# Patient Record
Sex: Female | Born: 1970 | Race: White | Hispanic: No | Marital: Married
Health system: Southern US, Academic
[De-identification: ages and names within clinical notes are randomized; demographics above are authoritative.]

---

## 2011-09-29 ENCOUNTER — Other Ambulatory Visit (HOSPITAL_COMMUNITY): Payer: Self-pay

## 2020-05-21 IMAGING — MR MRI JOINT UPPER EXTREMITY WITHOUT CONTRAST RT
6 series · 38 of 40 positions shown · IV contrast (gadolinium)
Comparison: Shoulder radiographs from outside facility dated 10/19/2019.

﻿EXAM:  MRI JOINT UPPER EXTREMITY WITHOUT CONTRAST RT
INDICATION: Chronic right shoulder pain.
TECHNIQUE: Multiplanar multisequential MRI of the right shoulder joint was performed without gadolinium contrast.

[Series 6: T1 · oblique · right · 3.5mm · 0.33mm/px · 7 of 21 slices shown]
[im 1/21]
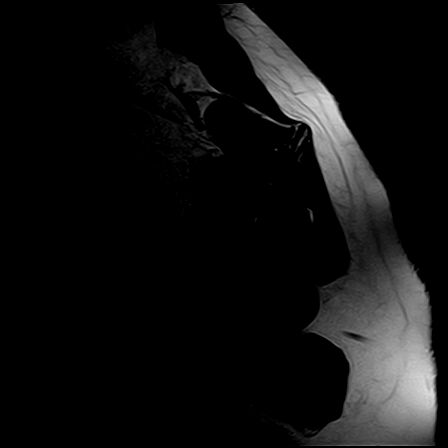
[im 4/21]
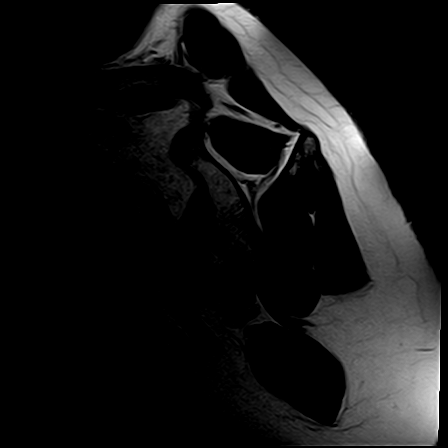
[im 7/21]
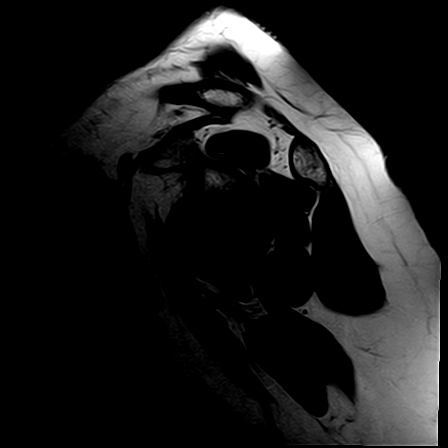
[im 11/21]
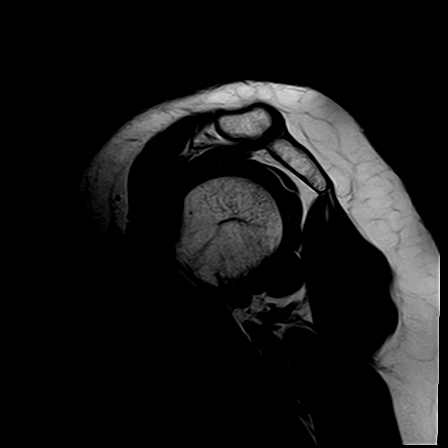
[im 14/21]
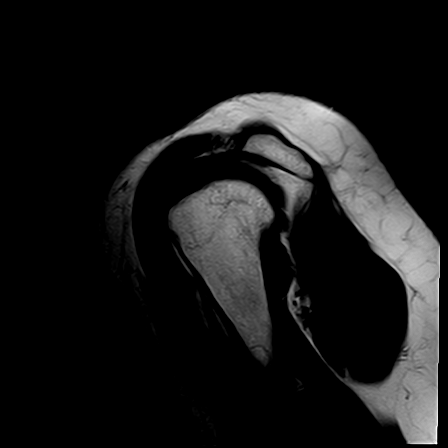
[im 17/21]
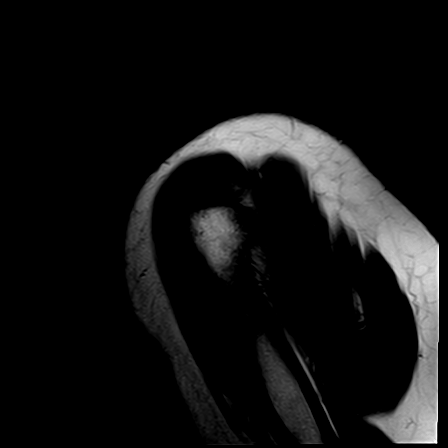
[im 21/21]
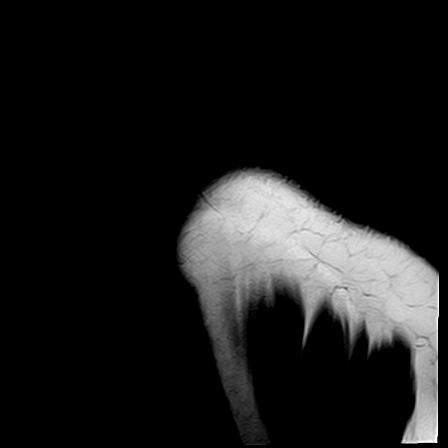

[Series 7: T2 fat-sat · axial · right · 4.0mm · 0.42mm/px · z∈[-38,+57]mm · 8 of 24 slices shown]
[im 1/24]
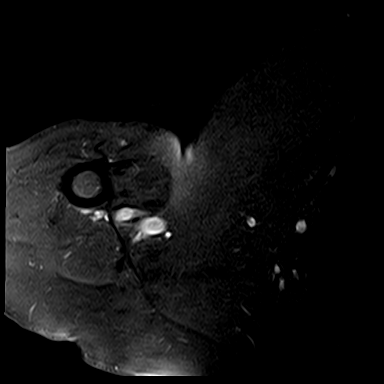
[im 4/24]
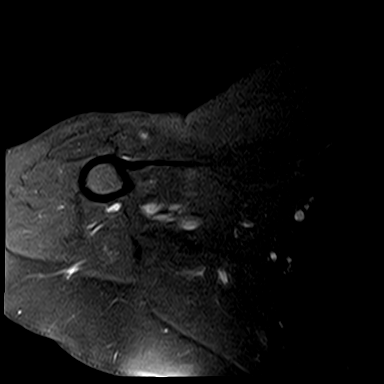
[im 7/24]
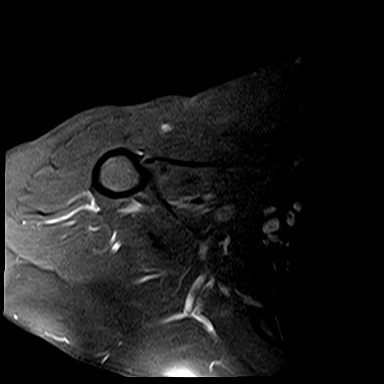
[im 10/24]
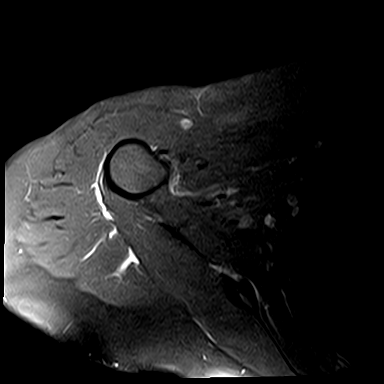
[im 14/24]
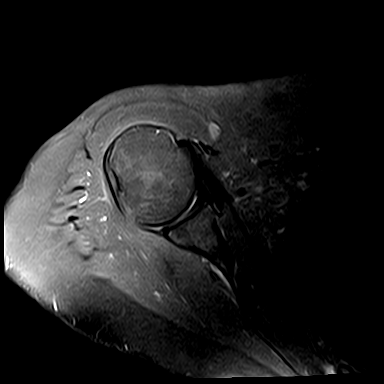
[im 17/24]
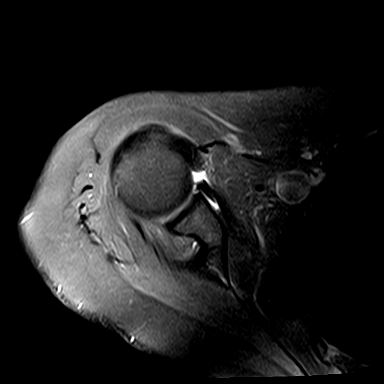
[im 20/24]
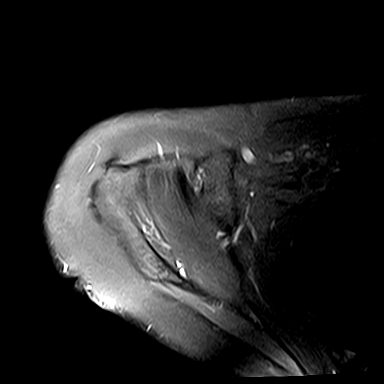
[im 24/24]
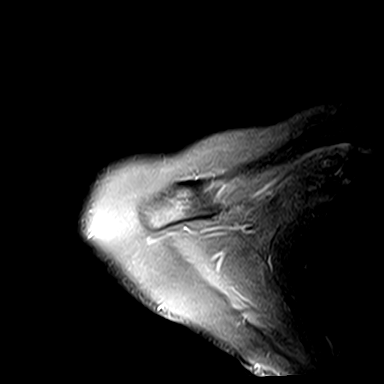

[Series 8: STIR · oblique · right · 3.5mm · 0.47mm/px · 6 of 21 slices shown (1 of 2)]
[im 1/21]
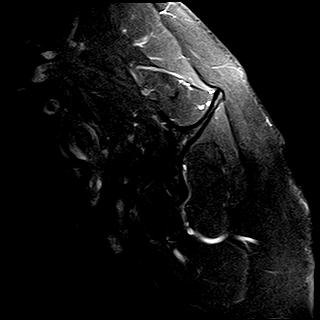
[im 5/21]
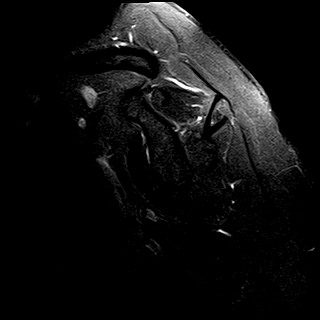
[im 9/21]
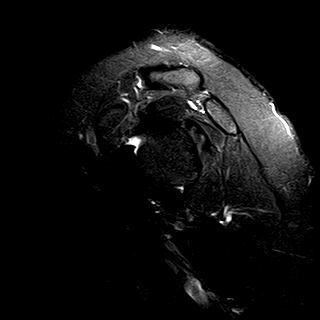
[im 13/21]
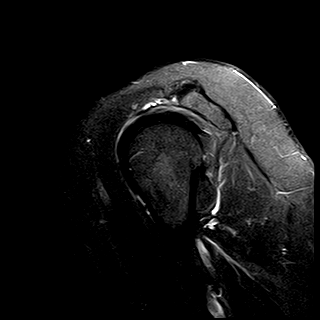
[im 17/21]
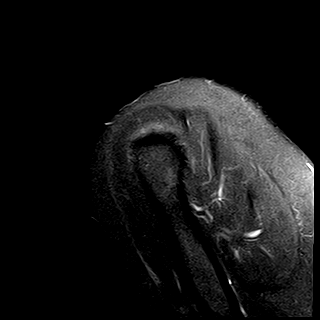
[im 21/21]
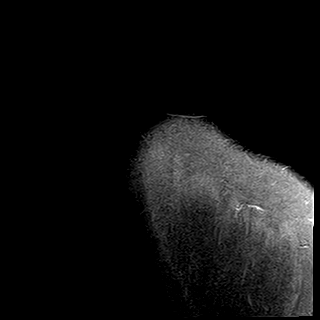

[Series 9: PD fat-sat · axial · right · 4.0mm · 0.50mm/px · z∈[-38,+57]mm · 7 of 24 slices shown (1 of 2)]
[im 1/24]
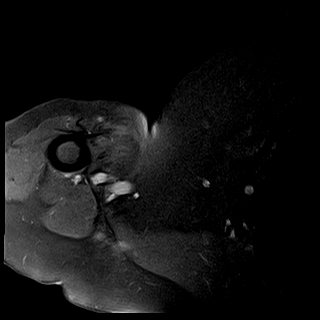
[im 4/24]
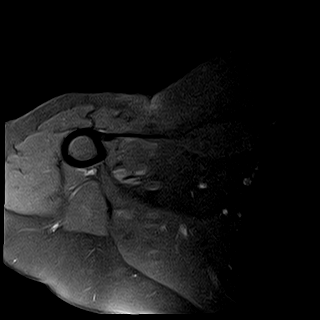
[im 8/24]
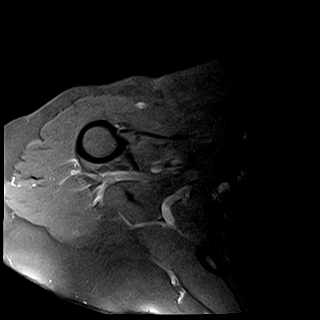
[im 12/24]
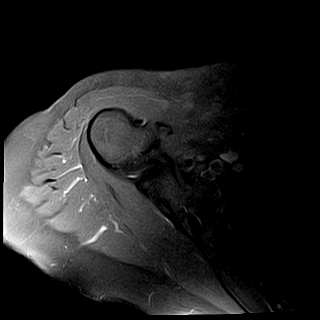
[im 16/24]
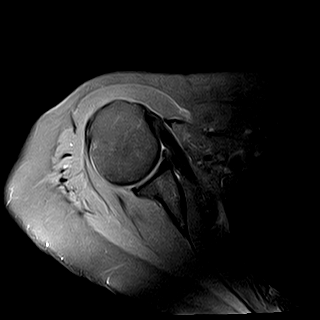
[im 20/24]
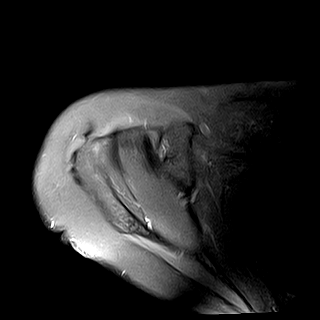
[im 24/24]
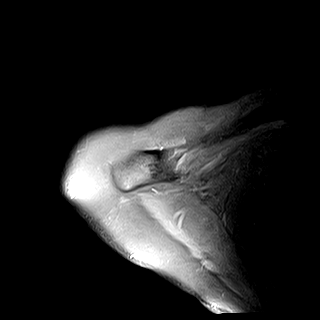

[Series 10: PD fat-sat · oblique · right · 3.5mm · 0.47mm/px · 6 of 21 slices shown (2 of 2)]
[im 1/21]
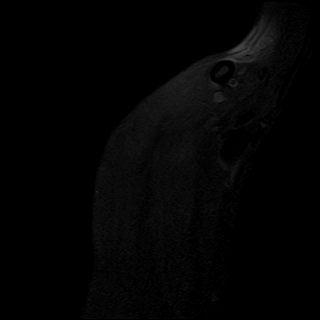
[im 5/21]
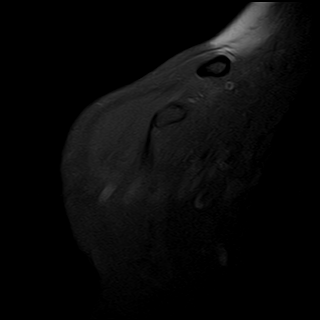
[im 9/21]
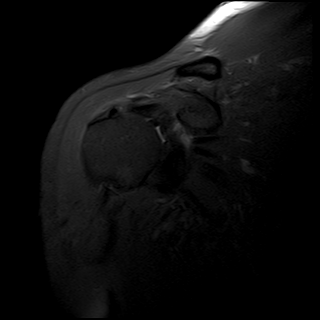
[im 13/21]
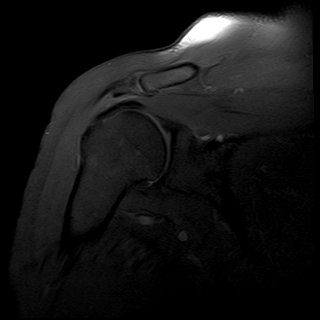
[im 17/21]
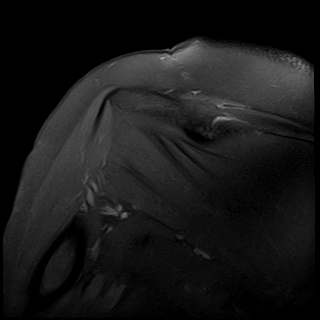
[im 21/21]
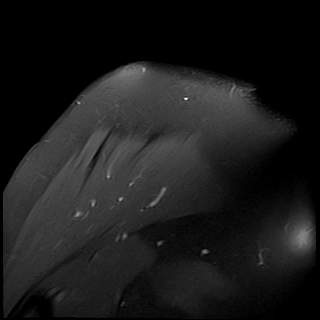

[Series 11: STIR · oblique · right · 3.5mm · 0.47mm/px · 4 of 21 slices shown (2 of 2)]
[im 1/21]
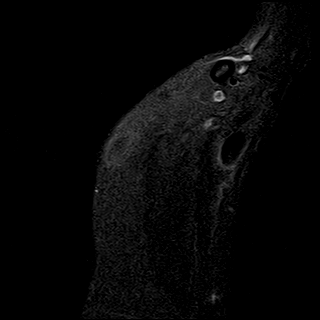
[im 5/21]
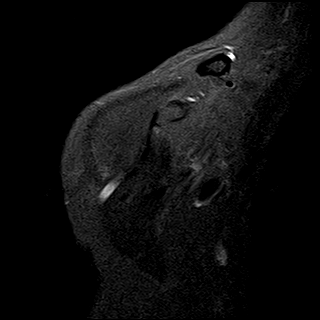
[im 9/21]
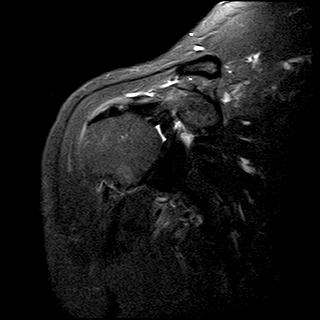
[im 13/21]
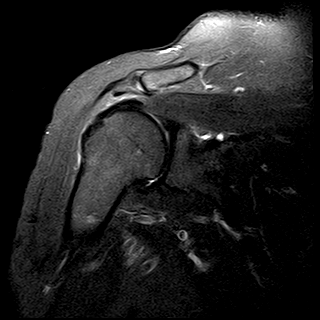

[38 of 40 positions shown; findings below may reference images not displayed]

FINDINGS: There is mild supraspinatus calcific tendinopathy. Infraspinatus, teres minor and subscapularis muscles and tendons are within normal limits in morphology and signal intensity. Long head of biceps tendon is well seated within the intertubercular groove and attaches normally to the biceps anchor. Superior labrum is intact. Glenohumeral articular cartilage is well maintained. Acromioclavicular joint is unremarkable. There is no significant fluid within the subacromial/subdeltoid bursa. Muscle bulk and bone marrow signal intensity are normal. No mass is seen along the course of the suprascapular nerve, within the spinoglenoid notch or within the quadrilateral space.
IMPRESSION: Mild supraspinatus calcific tendinopathy.

## 2020-05-21 IMAGING — MR MRI JOINT UPPER EXTREMITY WITHOUT CONTRAST LT
6 series · 39 of 40 positions shown · IV contrast (gadolinium)
Comparison: Radiographs from outside facility dated 10/19/2019.

﻿EXAM:  MRI JOINT UPPER EXTREMITY WITHOUT CONTRAST LT
INDICATION: Chronic left shoulder pain.
TECHNIQUE: Multiplanar multisequential MRI of the left shoulder joint was performed without gadolinium contrast.

[Series 6: T1 · oblique · left · 3.5mm · 0.33mm/px · 6 of 21 slices shown]
[im 1/21]
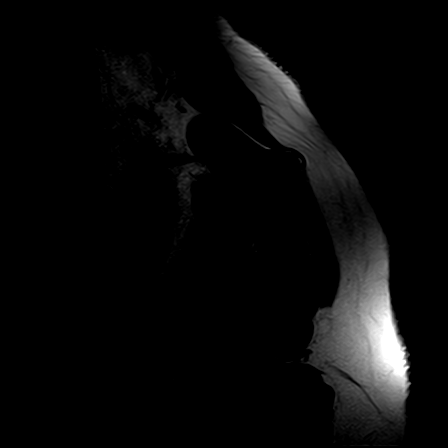
[im 5/21]
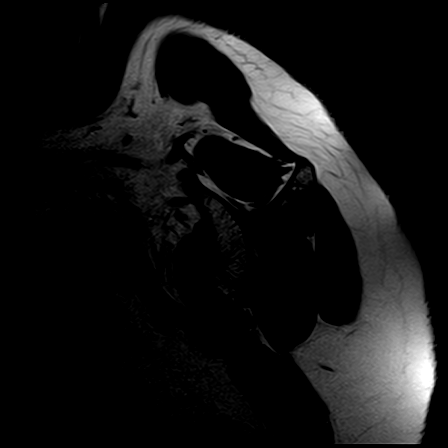
[im 9/21]
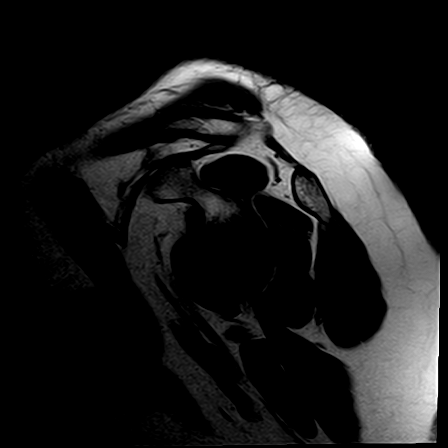
[im 13/21]
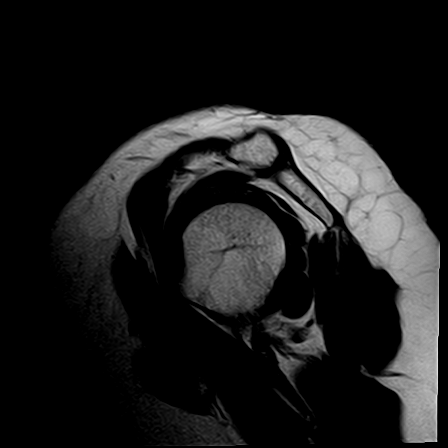
[im 17/21]
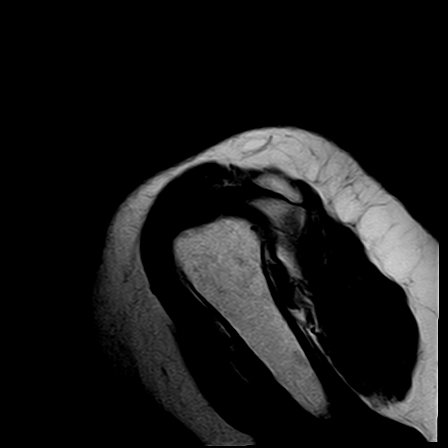
[im 21/21]
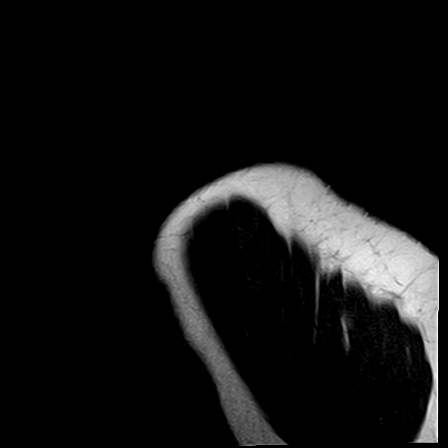

[Series 7: PD fat-sat · axial · left · 4.0mm · 0.50mm/px · z∈[-113,-34]mm · 6 of 21 slices shown (1 of 2)]
[im 1/21]
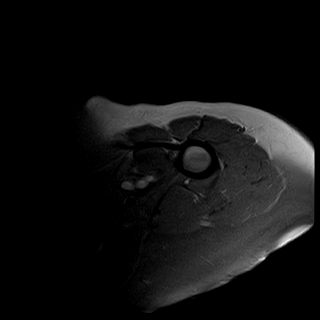
[im 5/21]
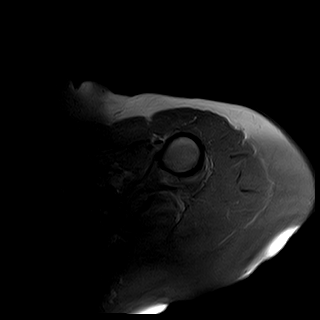
[im 9/21]
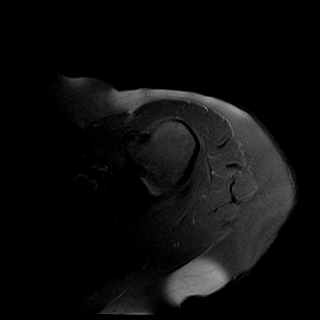
[im 13/21]
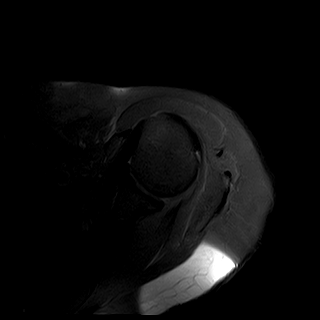
[im 17/21]
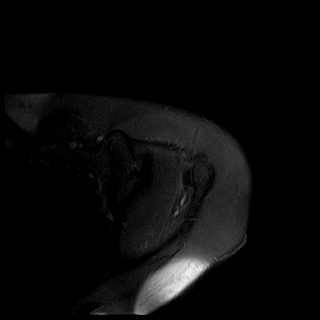
[im 21/21]
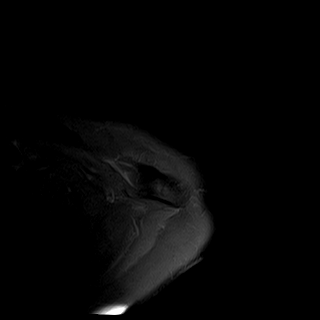

[Series 8: STIR · oblique · left · 3.5mm · 0.47mm/px · 7 of 21 slices shown (1 of 2)]
[im 1/21]
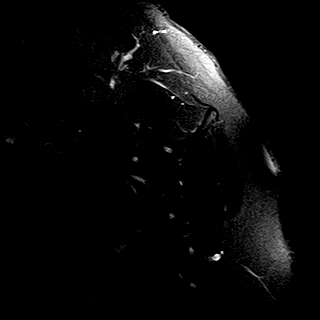
[im 4/21]
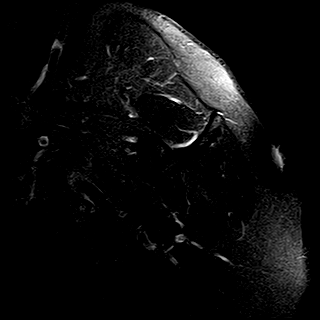
[im 7/21]
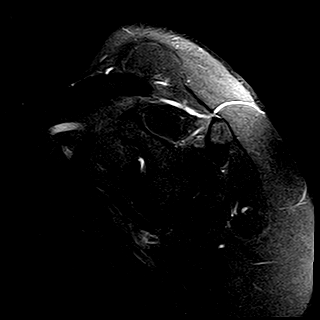
[im 11/21]
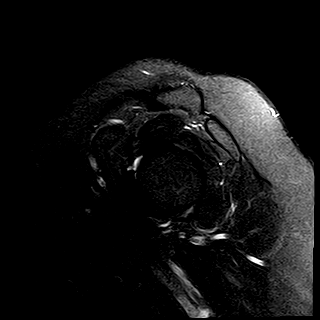
[im 14/21]
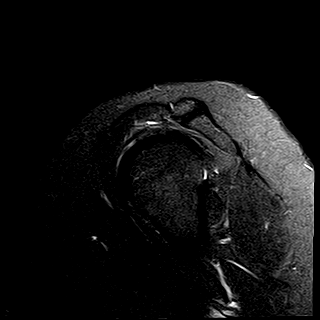
[im 17/21]
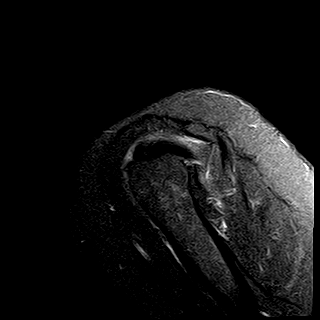
[im 21/21]
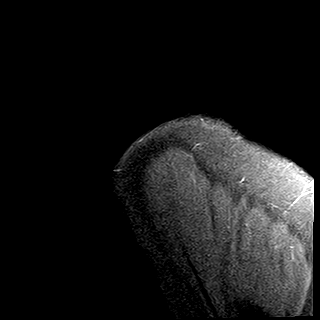

[Series 9: T2 fat-sat · axial · left · 4.0mm · 0.42mm/px · z∈[-113,-34]mm · 7 of 21 slices shown]
[im 1/21]
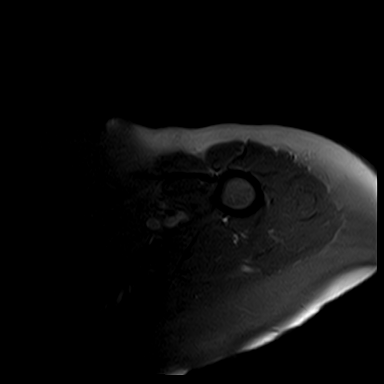
[im 4/21]
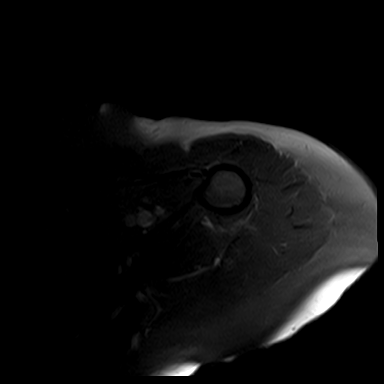
[im 7/21]
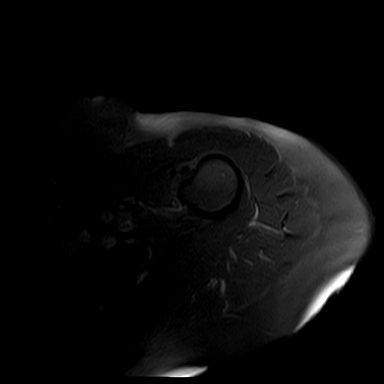
[im 11/21]
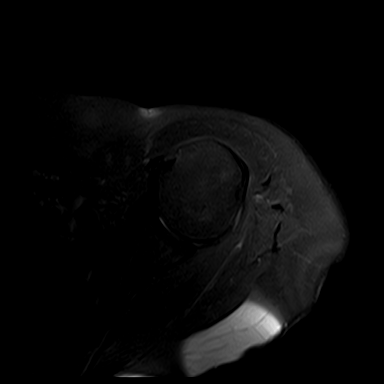
[im 14/21]
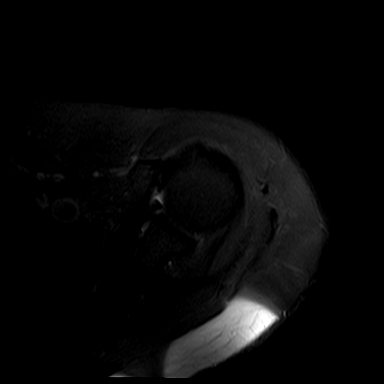
[im 17/21]
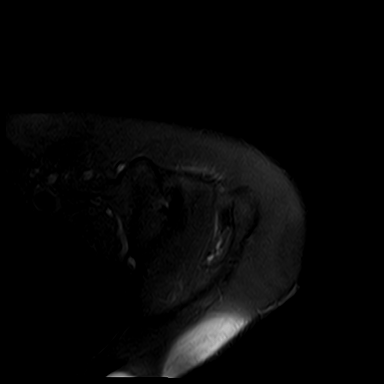
[im 21/21]
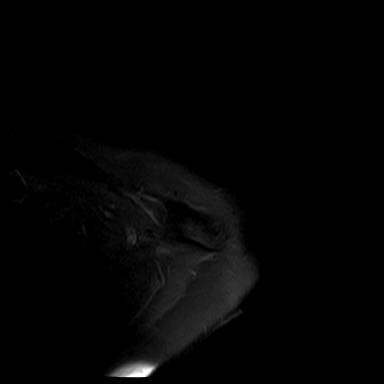

[Series 10: PD fat-sat · oblique · left · 3.5mm · 0.47mm/px · 7 of 21 slices shown (2 of 2)]
[im 1/21]
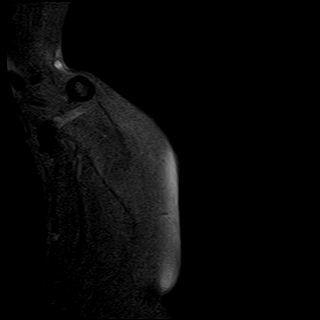
[im 4/21]
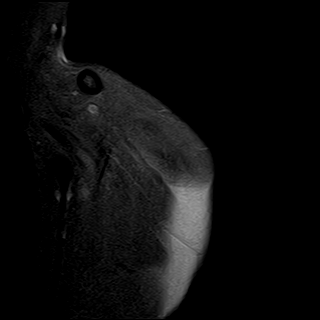
[im 7/21]
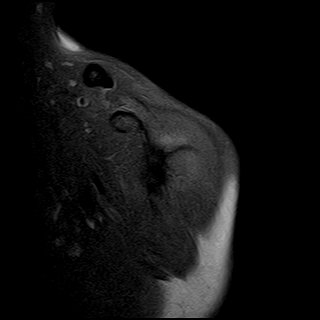
[im 11/21]
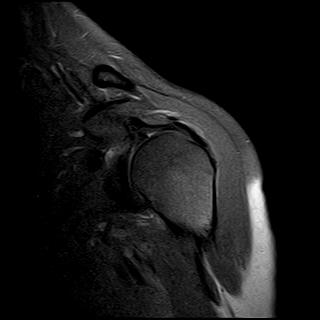
[im 14/21]
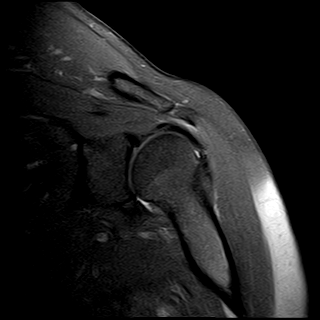
[im 17/21]
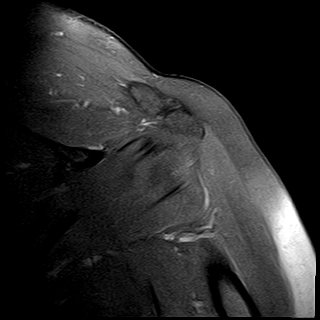
[im 21/21]
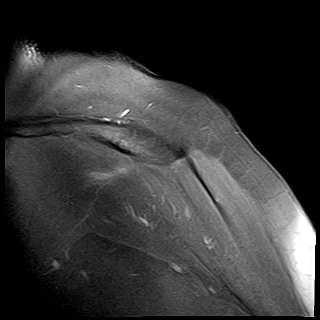

[Series 11: STIR · oblique · left · 3.5mm · 0.47mm/px · 6 of 21 slices shown (2 of 2)]
[im 1/21]
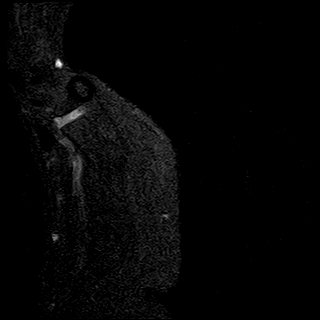
[im 4/21]
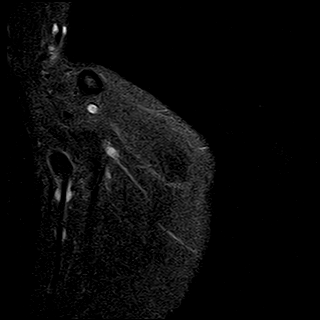
[im 7/21]
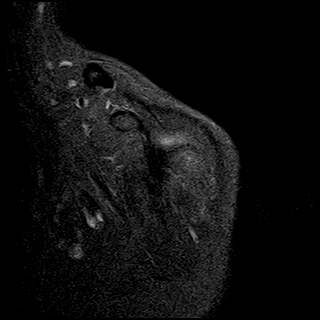
[im 11/21]
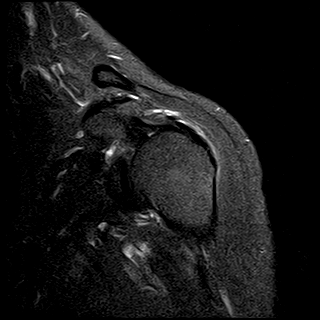
[im 14/21]
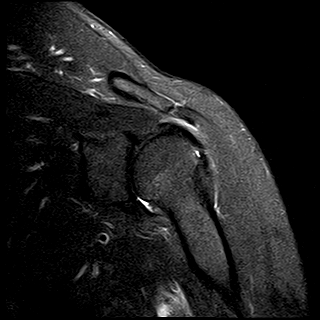
[im 17/21]
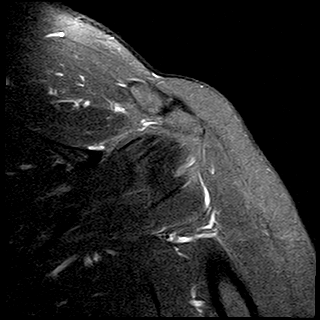

[39 of 40 positions shown; findings below may reference images not displayed]

FINDINGS: Calcific tendinopathy of the supraspinatus is again identified. Infraspinatus, teres minor and subscapularis muscles and tendons are within normal limits in morphology and signal intensity. Long head of biceps tendon is well seated within the intertubercular groove and attaches normally to the biceps anchor. Superior labrum is intact. Glenohumeral articular cartilage is well maintained. There is no significant fluid within the subacromial/subdeltoid bursa. Muscle bulk and bone marrow signal intensity are normal. No mass is seen along the course of the suprascapular nerve, within the spinoglenoid notch or within the quadrilateral space.
IMPRESSION: Stable supraspinatus calcific tendinopathy.

## 2021-04-30 LAB — LAB: COLOGUARD RESULT: NEGATIVE

## 2022-02-19 ENCOUNTER — Other Ambulatory Visit: Payer: Self-pay

## 2022-02-19 ENCOUNTER — Inpatient Hospital Stay
Admission: RE | Admit: 2022-02-19 | Discharge: 2022-02-19 | Disposition: A | Discharge status: Home / Self Care | Payer: BLUE CROSS/BLUE SHIELD | Source: Ambulatory Visit | Attending: NURSE PRACTITIONER | Admitting: NURSE PRACTITIONER

## 2022-02-19 ENCOUNTER — Other Ambulatory Visit (HOSPITAL_COMMUNITY): Payer: Self-pay | Admitting: NURSE PRACTITIONER

## 2022-02-19 DIAGNOSIS — M25512 Pain in left shoulder: Secondary | ICD-10-CM

## 2022-02-19 DIAGNOSIS — R52 Pain, unspecified: Secondary | ICD-10-CM

## 2022-03-18 ENCOUNTER — Ambulatory Visit
Admission: RE | Admit: 2022-03-18 | Discharge: 2022-03-18 | Disposition: A | Discharge status: Still In Hospital | Payer: BLUE CROSS/BLUE SHIELD | Source: Ambulatory Visit | Attending: NURSE PRACTITIONER | Admitting: NURSE PRACTITIONER

## 2022-03-18 ENCOUNTER — Other Ambulatory Visit: Payer: Self-pay

## 2022-04-12 ENCOUNTER — Other Ambulatory Visit: Payer: BC Managed Care – PPO

## 2022-04-12 ENCOUNTER — Other Ambulatory Visit: Payer: Self-pay

## 2022-04-12 DIAGNOSIS — E039 Hypothyroidism, unspecified: Secondary | ICD-10-CM

## 2022-04-12 DIAGNOSIS — E785 Hyperlipidemia, unspecified: Secondary | ICD-10-CM | POA: Insufficient documentation

## 2022-04-12 DIAGNOSIS — G4700 Insomnia, unspecified: Secondary | ICD-10-CM

## 2022-04-12 LAB — LIPID PANEL
CHOL/HDL RATIO: 4.6
CHOLESTEROL: 234 mg/dL — ABNORMAL HIGH (ref <200)
HDL CHOL: 51 mg/dL (ref 23–92)
LDL CALC: 169 mg/dL — ABNORMAL HIGH (ref 0–100)
TRIGLYCERIDES: 68 mg/dL (ref <=150)
VLDL CALC: 14 mg/dL (ref 0–50)

## 2022-04-12 LAB — CBC
HCT: 36.4 % — ABNORMAL LOW (ref 37.0–47.0)
HGB: 12.7 g/dL (ref 12.5–16.0)
MCH: 29.9 pg (ref 27.0–32.0)
MCHC: 35 g/dL (ref 32.0–36.0)
MCV: 85.3 fL (ref 78.0–99.0)
MPV: 7.7 fL (ref 7.4–10.4)
PLATELETS: 222 10*3/uL (ref 140–440)
RBC: 4.27 10*6/uL (ref 4.20–5.40)
RDW: 14 % (ref 11.6–14.8)
WBC: 4.4 10*3/uL (ref 4.0–10.5)
WBCS UNCORRECTED: 4.4 10*3/uL

## 2022-04-12 LAB — BASIC METABOLIC PANEL
ANION GAP: 6 mmol/L (ref 4–13)
BUN/CREA RATIO: 31 — ABNORMAL HIGH (ref 6–22)
BUN: 21 mg/dL (ref 7–25)
CALCIUM: 9.2 mg/dL (ref 8.6–10.3)
CHLORIDE: 109 mmol/L — ABNORMAL HIGH (ref 98–107)
CO2 TOTAL: 25 mmol/L (ref 21–31)
CREATININE: 0.67 mg/dL (ref 0.60–1.30)
ESTIMATED GFR: 106 mL/min/{1.73_m2} (ref >59)
GLUCOSE: 89 mg/dL (ref 74–109)
OSMOLALITY, CALCULATED: 282 mOsm/kg (ref 270–290)
POTASSIUM: 4.2 mmol/L (ref 3.5–5.1)
SODIUM: 140 mmol/L (ref 136–145)

## 2022-04-12 LAB — HEPATIC FUNCTION PANEL
ALBUMIN/GLOBULIN RATIO: 1.5 — ABNORMAL HIGH (ref 0.8–1.4)
ALBUMIN: 4.5 g/dL (ref 3.5–5.7)
ALKALINE PHOSPHATASE: 51 U/L (ref 34–104)
ALT (SGPT): 18 U/L (ref 7–52)
AST (SGOT): 24 U/L (ref 13–39)
BILIRUBIN DIRECT: 0.05 md/dL (ref <=0.20)
BILIRUBIN TOTAL: 0.5 mg/dL (ref 0.3–1.2)
BILIRUBIN, INDIRECT: 0.45 mg/dL (ref <=1)
GLOBULIN: 3.1 (ref 2.9–5.4)
PROTEIN TOTAL: 7.6 g/dL (ref 6.4–8.9)

## 2022-04-12 LAB — URINALYSIS, MICROSCOPIC
RBCS: 3 /hpf (ref <4)
SQUAMOUS EPITHELIAL: 1 /hpf (ref <28)
WBCS: 17 /hpf — ABNORMAL HIGH (ref <6)

## 2022-04-12 LAB — URINALYSIS, MACROSCOPIC
BILIRUBIN: NEGATIVE mg/dL
BLOOD: NEGATIVE mg/dL
GLUCOSE: NEGATIVE mg/dL
KETONES: NEGATIVE mg/dL
LEUKOCYTES: 250 WBCs/uL — AB
PH: 6 (ref 5.0–9.0)
PROTEIN: NEGATIVE mg/dL
SPECIFIC GRAVITY: 1.023 (ref 1.002–1.030)
UROBILINOGEN: NORMAL mg/dL

## 2022-04-12 LAB — VITAMIN D 25 TOTAL: VITAMIN D: 59 ng/mL (ref 30–100)

## 2022-04-12 LAB — THYROID STIMULATING HORMONE (SENSITIVE TSH): TSH: 0.616 u[IU]/mL (ref 0.450–5.330)

## 2022-04-15 ENCOUNTER — Other Ambulatory Visit (HOSPITAL_COMMUNITY): Payer: Self-pay

## 2022-04-15 DIAGNOSIS — Z1231 Encounter for screening mammogram for malignant neoplasm of breast: Secondary | ICD-10-CM

## 2022-04-15 DIAGNOSIS — Z780 Asymptomatic menopausal state: Secondary | ICD-10-CM

## 2022-04-25 ENCOUNTER — Other Ambulatory Visit: Payer: Self-pay

## 2022-04-25 ENCOUNTER — Inpatient Hospital Stay
Admission: RE | Admit: 2022-04-25 | Discharge: 2022-04-25 | Disposition: A | Discharge status: Home / Self Care | Payer: BC Managed Care – PPO | Source: Ambulatory Visit

## 2022-04-25 ENCOUNTER — Inpatient Hospital Stay (HOSPITAL_COMMUNITY)
Admission: RE | Admit: 2022-04-25 | Discharge: 2022-04-25 | Disposition: A | Discharge status: Home / Self Care | Payer: BC Managed Care – PPO | Source: Ambulatory Visit

## 2022-04-25 ENCOUNTER — Encounter (HOSPITAL_COMMUNITY): Payer: Self-pay

## 2022-04-25 DIAGNOSIS — Z1231 Encounter for screening mammogram for malignant neoplasm of breast: Secondary | ICD-10-CM | POA: Insufficient documentation

## 2022-04-25 DIAGNOSIS — Z780 Asymptomatic menopausal state: Secondary | ICD-10-CM

## 2022-10-14 IMAGING — MR MRI SHOULDER LT W/O CONTRAST
6 series · 38 of 40 positions shown · IV contrast (gadolinium)
Comparison: Left shoulder x-ray dated 10/19/2019.

﻿EXAM:  [id number]   MRI SHOULDER LT W/O CONTRAST
INDICATION: Persistent left shoulder pain and diminished range of motion.  No prior shoulder surgery.
TECHNIQUE: Multiplanar, multisequential MRI of the left shoulder was performed without gadolinium contrast.

[Series 6: T1 · oblique · left · 3.5mm · 0.33mm/px · 7 of 18 slices shown]
[im 1/18]
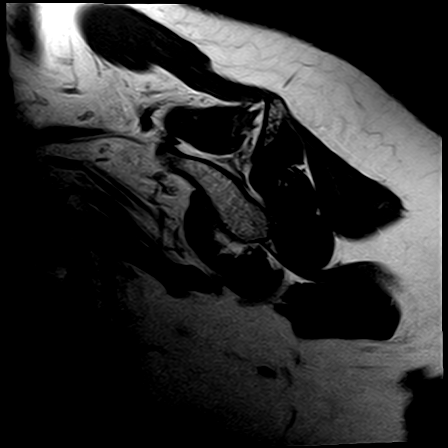
[im 3/18]
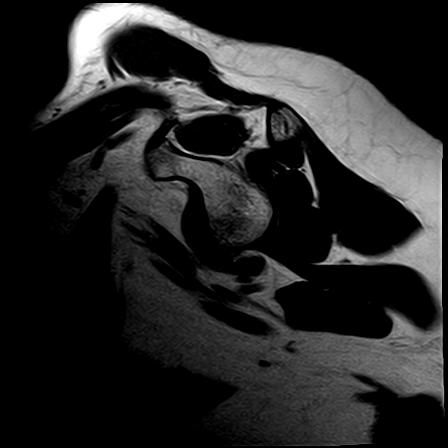
[im 6/18]
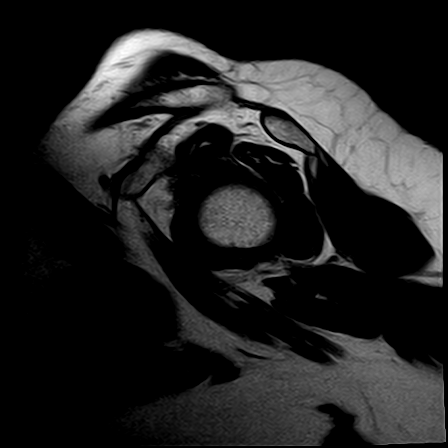
[im 9/18]
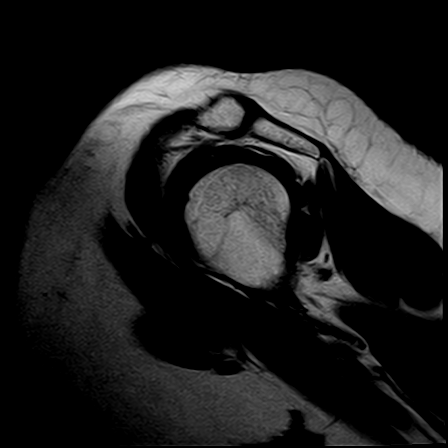
[im 12/18]
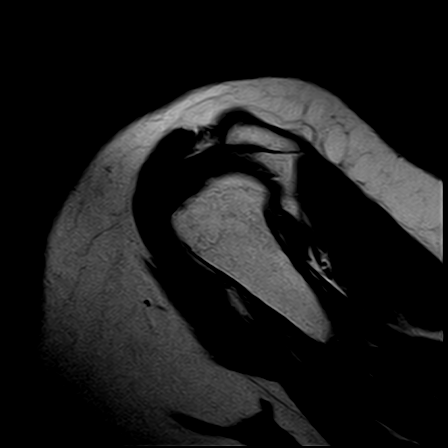
[im 15/18]
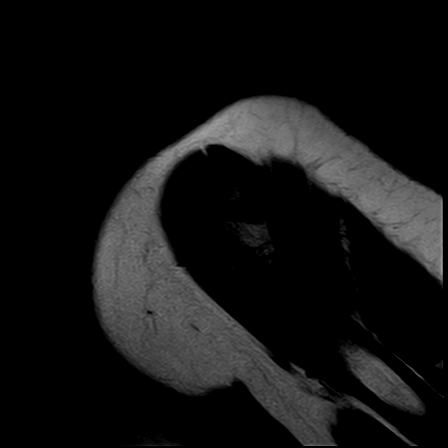
[im 18/18]
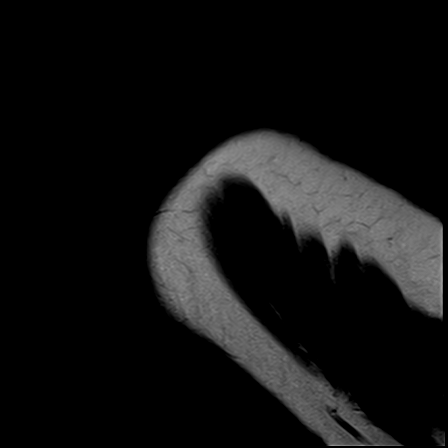

[Series 7: STIR · oblique · left · 3.5mm · 0.47mm/px · 7 of 18 slices shown (1 of 2)]
[im 1/18]
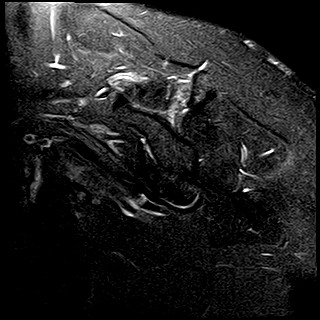
[im 3/18]
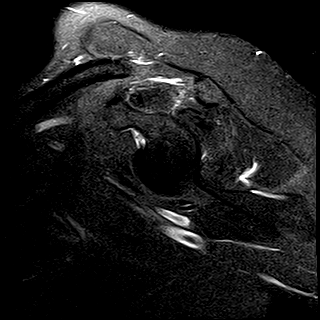
[im 6/18]
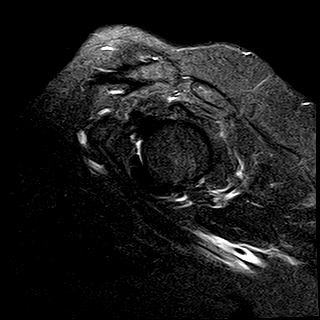
[im 9/18]
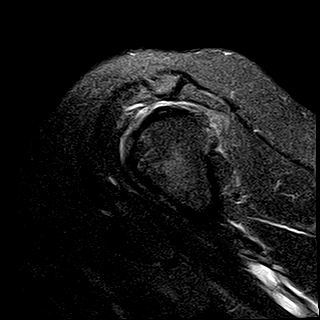
[im 12/18]
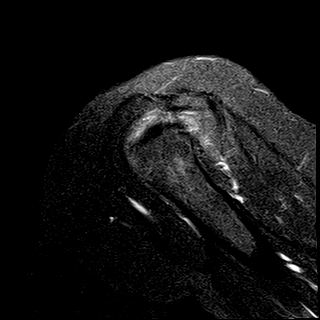
[im 15/18]
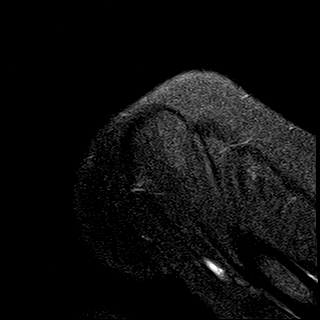
[im 18/18]
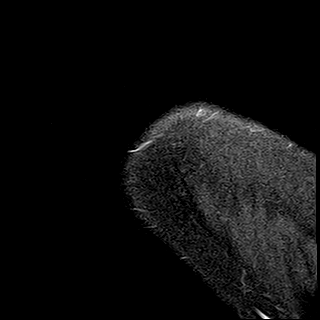

[Series 8: PD fat-sat · axial · left · 4.0mm · 0.50mm/px · z∈[-112,-38]mm · 6 of 18 slices shown (1 of 2)]
[im 1/18]
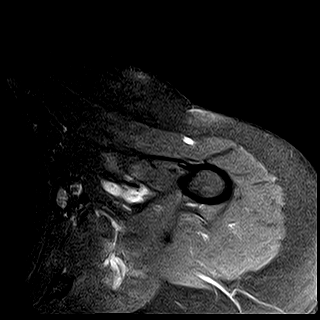
[im 4/18]
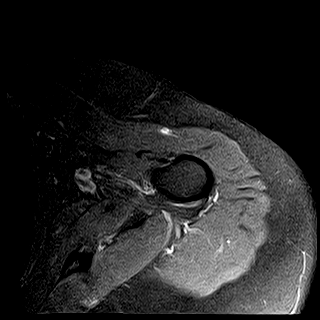
[im 7/18]
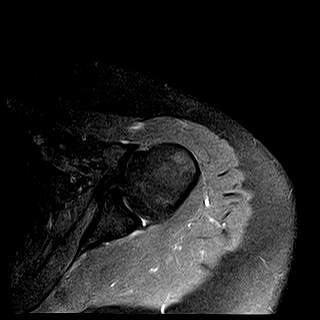
[im 11/18]
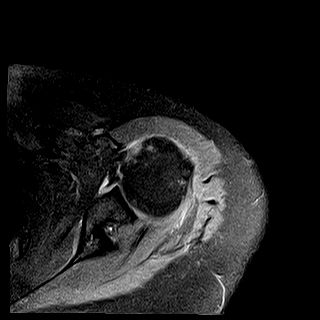
[im 14/18]
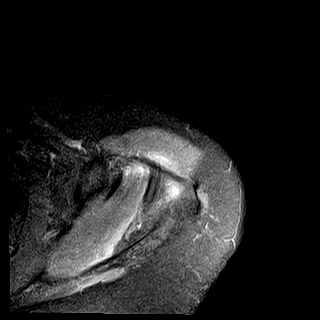
[im 18/18]
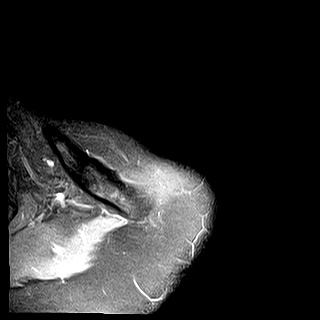

[Series 9: T2 fat-sat · axial · left · 4.0mm · 0.42mm/px · z∈[-125,-25]mm · 8 of 24 slices shown]
[im 1/24]
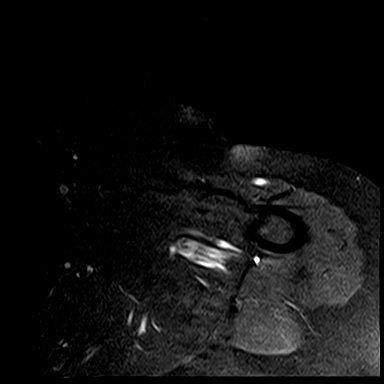
[im 4/24]
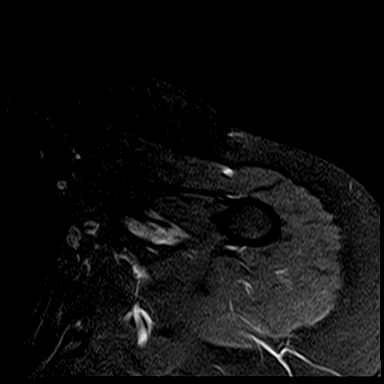
[im 7/24]
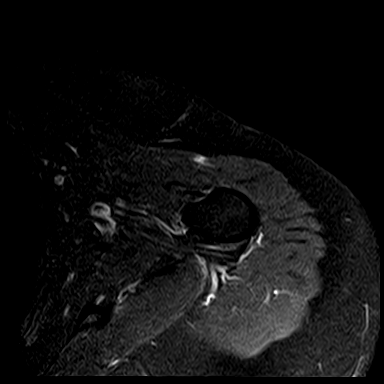
[im 10/24]
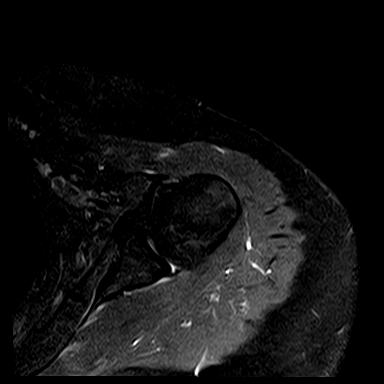
[im 14/24]
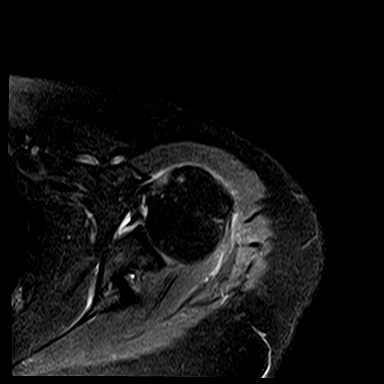
[im 17/24]
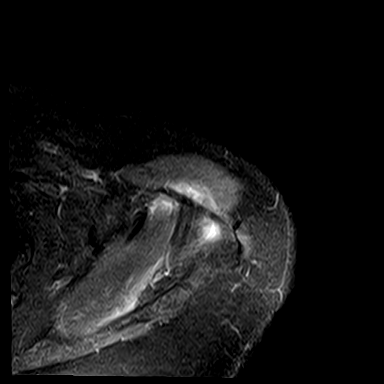
[im 20/24]
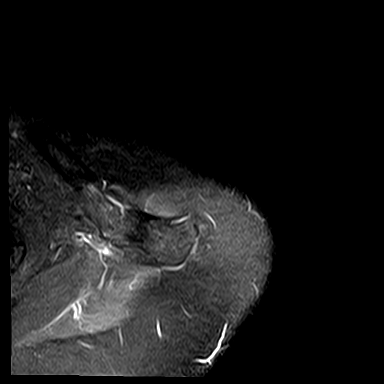
[im 24/24]
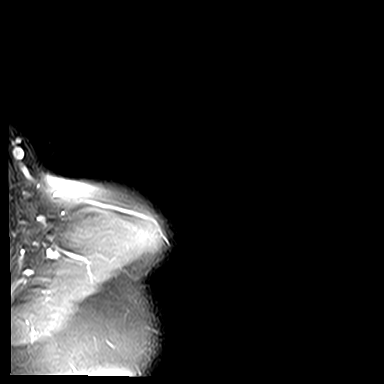

[Series 10: PD fat-sat · oblique · left · 3.5mm · 0.47mm/px · 6 of 18 slices shown (2 of 2)]
[im 1/18]
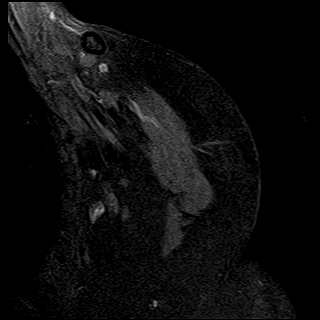
[im 4/18]
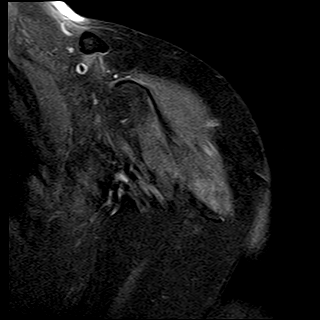
[im 7/18]
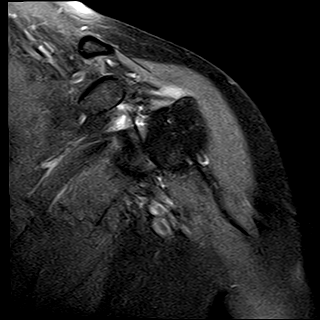
[im 11/18]
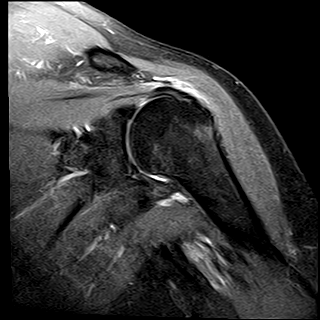
[im 14/18]
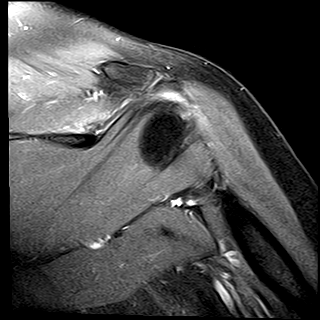
[im 18/18]
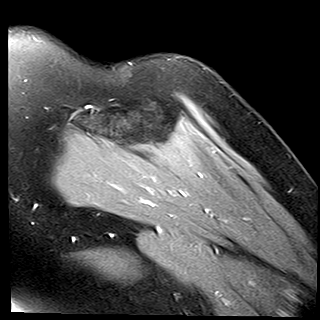

[Series 11: STIR · oblique · left · 3.5mm · 0.47mm/px · 4 of 18 slices shown (2 of 2)]
[im 1/18]
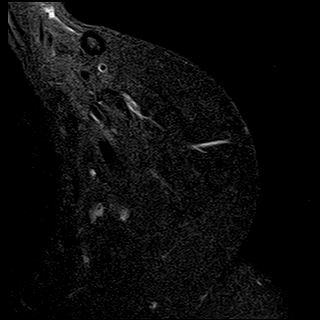
[im 4/18]
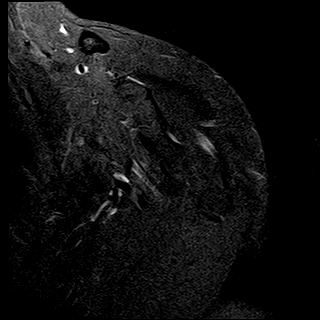
[im 7/18]
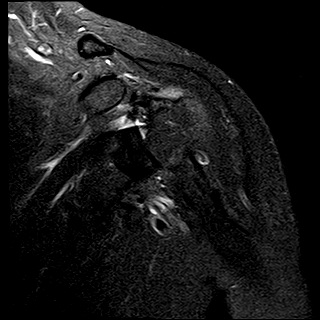
[im 11/18]
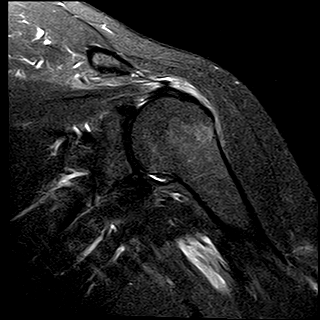

[38 of 40 positions shown; findings below may reference images not displayed]

FINDINGS: No acute bony lesions are seen at the left shoulder. 

Significant tendinitis of the supraspinatus tendon is noted with small calcific deposit suggesting calcific tendinitis.  Intrasubstance small partial-thickness tear is noted.  No evidence of full-thickness tear is seen.  Degenerative changes of AC joint are mildly impinging on the subacromion space and the supraspinatus.

Mild inflammatory changes of infraspinatus tendon.

No acute findings of the glenoid labrum.  Long head of biceps tendon is intact.
IMPRESSION: 1. No acute bone changes at the left shoulder.

2. Significant calcific tendinitis of distal supraspinatus tendon is noted with small intrasubstance partial-thickness tear.  No full-thickness tear of the rotator cuff tendon is noted.

3. Mild tendinopathy of the superior aspect of the distal infraspinatus tendon.

4. No definite labral tear. Long head of biceps tendon is intact.

## 2022-10-27 ENCOUNTER — Other Ambulatory Visit: Payer: Self-pay

## 2022-10-27 ENCOUNTER — Encounter (HOSPITAL_COMMUNITY): Payer: Self-pay

## 2022-10-27 ENCOUNTER — Other Ambulatory Visit (HOSPITAL_COMMUNITY): Payer: Self-pay | Admitting: Orthopaedic Surgery

## 2022-10-27 ENCOUNTER — Ambulatory Visit (HOSPITAL_COMMUNITY)
Admission: RE | Admit: 2022-10-27 | Discharge: 2022-10-27 | Disposition: A | Discharge status: Still In Hospital | Payer: BC Managed Care – PPO | Source: Ambulatory Visit | Attending: Orthopaedic Surgery | Admitting: Orthopaedic Surgery

## 2022-10-27 DIAGNOSIS — S59902D Unspecified injury of left elbow, subsequent encounter: Secondary | ICD-10-CM

## 2022-10-27 DIAGNOSIS — M25622 Stiffness of left elbow, not elsewhere classified: Secondary | ICD-10-CM | POA: Insufficient documentation

## 2022-10-27 DIAGNOSIS — M25521 Pain in right elbow: Secondary | ICD-10-CM

## 2022-10-27 NOTE — PT Evaluation (Signed)
[facility]  Outpatient Physical Therapy  [place], [address]  [phone]  [phone]       Physical Therapy Upper Extremity Evaluation    Date: 10/27/2022  Patient's Name: Terry Fuller  Date of Birth: [birth year]  Physical Therapy Evaluation      PT diagnosis/Reason for Referral: Right elbow pain           SUBJECTIVE  Date of onset: 02/15/2022 and re-injury 07/18/2022    Mechanism of injury: Fall onto R side    Previous episodes/treatments: PT for calcium deposits in the L shoulder    PLOF: No issues with ADLs. Sleeping was affected.    Medications for this problem:  On file    Diagnostic tests: MRI of the L shoulder obtained on 10/14/2022 indicates,   No acute bone changes at the left shoulder.  Significant calcific tendonitis of distal supraspinatus tendon is noted with small intra-substance partial- thickness tear. No full-thickness tear of the rotator cuff tendon is noted.  Mild tendonopathy of the superior aspect of the distal infraspinatus tendon.  No definite labral tear. Long head of the biceps tendon is intact.    Patient goals: REDUCE PAIN, RETURN TO WORK, and NORMALIZE FUNCTION    Occupation:  Insurance underwriter.    Next MD visit: None scheduled at this time.    Pain location: Anterior L shoulder                    Pain description: SHARP, DULL, and PRESSURE    Pain frequency:  INTERMITTENT    Pain rating: Now 1   Best 0   Worst 2    Radiculopathy: Occasional numbness down to the level of the elbow in the LUE.    Pain increases with: ACTIVITY, LIFTING, and Sleeping on the L side           decreases with : HEAT and POSITION CHANGE    Sensation: Occasional numbness down to the level of the elbow in the LUE.    Weakness: Believes both arms have widespread weakness.    Sleep affected: Every 2-3 hours    Headaches: Denies    Dizziness: Denies    Subjective Functional Reports:    Sitting: WFL    Standing: WFL    Walking: WFL    Lifting:  LIMITED        Patient-Specific Functional Score:    Problem Score   1. Driving 8   2. Workplace duties 5   TOTAL 6.5     OBJECTIVE    Shoulder AROM   right left   Flexion 114 119   Extension 39 52   Abduction 81 127   ER 64 79   IR 48 36       Elbow Q angle: (matched the L elbow degree of flexion to that of the R elbow which has a significant flexion contracture)  L: 11  R: 17    Elbow AROM    right left   Flexion 121 148   Extension -25 -6   Supination  89 90   Pronation 83 78      ROM comment: All AROM for R shoulder limited by R elbow pain.    Strength (defined as __/5 based on 0/5 - 5/5 grading system)     right left   Shoulder flexion 4+ 4+   Shoulder abduction  4+ 4   Shoulder IR 4(shift in elbow  appreciated) 4+   Shoulder ER 4 5   Elbow flexion  4+ 5   Elbow extension  4-(pain) 4+   Supination 5 5   Pronation 4+ 5   Wrist extension  4(pain) 4+   Wrist flexion  5 5       Joint mobility: Hypermobile R elbow joint    Palpation: TTP over wrist extensors and radiohumeral joint both on the R side.    Special tests:   Shoulder special tests    YERGASON'S TEST: NEGATIVE LEFT and NEGATIVE RIGHT    Elbow special tests:  Valgus: Neg R  Varus: Pos R    Treatment provided:REVIEW OF POC AND GOALS WITH PATIENT, ALL QUESTIONS ANSWERED and PATIENT EDUCATION  Access Code: [email or web address]  URL: [email or web address]  Date: 10/27/2022  Prepared by: Charlotte Sanes    Exercises  - Forearm Pronation with Resistance  - 1-2 x daily - 7 x weekly - 2 sets - 8 reps - 2s hold  - Forearm Supination with Resistance  - 1-2 x daily - 7 x weekly - 2 sets - 8 reps - 2s hold  - Standing Single Arm Elbow Flexion with Resistance  - 1-2 x daily - 7 x weekly - 3 sets - 12 reps  - Standing Elbow Extension with Self-Anchored Resistance  - 1-2 x daily - 7 x weekly - 3 sets - 12 reps          ASSESSMENT    Impression: Patient presents with R elbow and L shoulder pain following a fall. Xray imaging indicated no fx of the R elbow and MRI of the L shoulder  indicated a partial thickness tear of the distal supraspinatus tendon. No MRI was obtained for the R elbow and clinical testing does indicate instability during varus stressing. Valgus did likewise exhibit excessive motion, but may be a false positive as the patient has a significant flexion contracture of the R elbow and stabilization did not offer an accurate force to be applied. An LCL injury is likely, but the degree is unknown. AROM of the shoulders showed the L(affected) side to have more motion and within a pain free range compared to the R side. No special testing was performed for the L shoulder as the patient is not concerned by it and AROM/MMT showed no major deficits. Should conservative treatment not offer any meaningful effect by the end of 6 weeks, the patient should likely have an MRI of the R elbow in order to assess the potential damage full. Patient would benefit from skilled physical therapy services in order to address deficits in R elbow pain, R elbow and wrist flexion and extension respectively, and workplace related lifting tasks for improved completion of ADLs and community involvement.      Rehab potential: FAIR      Short term goals (3 weeks):  -Patient will achieve a resting pain level in the R elbow of no greater than 2/10.  -Patient will achieve a R elbow extension AROM of at least -14 degrees and without onset of pain greater than 2/10.  -Patient will achieve a R elbow flexion AROM of at least 135 degrees and without onset of pain greater than 2/10.  -Patient will achieve a R wrist extension MMT of at least 4+ and without onset of pain greater than 3/10.  -Patient will have no great than a 2/10 pain response during varus testing of the R elbow.  -Patient will be independent with her HEP.  Long term goals (6 weeks):  -Patient will achieve a resting pain level in the R elbow of no greater than 0/10.  -Patient will achieve a R elbow extension AROM of at least -8 degrees and without  onset of pain greater than 1/10.  -Patient will achieve a R elbow flexion AROM of at least 145 degrees and without onset of pain greater than 1/10.  -Patient will achieve a R wrist extension MMT of at least 4+ and without onset of pain greater than 1/10.  -Patient will have no great than a 1/10 pain response during varus testing of the R elbow.  -Patient will be independent with her final HEP.            PLAN  Patient will attend 2 times per week x 6 weeks. Therapy may include, but is not limited to THERAPEUTIC EXERCISES, MYOFASCIAL/JOINT MOBILIZATION, POSTURE/BODY MECHANICS, ERGONOMIC TRAINING, HOME INSTRUCTIONS, HEAT/COLD, ULTRASOUND, ELECTRICAL STIMULATION, and KINESIOTAPE    Plan for next visit: Trial kinesiotape for the lateral R elbow during the next session. Otherwise continue to progress stabilizing exercises for the R elbow per patient tolerance.       Evaluation complexity:   Personal factors impacting POC: OCCUPATIONAL ADLS (IE HEAVY LIFTING, REPETITIVE TASKS, LONG HOURS)   Co-morbidities impacting POC:  None  Complexity of physical exam: INCLUDING MUSCULOSKELETAL SYSTEM (POSTURE, ROM, STRENGTH, HEIGHT/WEIGHT) and INCLUDING ACTIVITY/MOBILITY RESTRICTIONS   Clinical Presentation: EVOLVING WITH CHANGING CLINICAL CHARACTERISTICS   Evaluation Complexity: LOW-HISTORY 0, EXAMINATION 1-2, STABLE PRESENTATION        Total Session Time 42        Intervention minutes: EVALUATION 39 minutes and THERAPEUTIC EXERCISE 3 minutes    Adalberto Cole, PT  10/27/2022, 14:58    Start of Service: _________          Certification:    From:______  Through:_________    I certify the need for these services furnished under this plan of treatment and while under my care.    Referring Provider Signature: _______________     Date : _____________________    Printed Name of Referring Provider: __________________________________________

## 2022-10-28 DIAGNOSIS — M25521 Pain in right elbow: Secondary | ICD-10-CM | POA: Insufficient documentation

## 2022-10-29 ENCOUNTER — Other Ambulatory Visit: Payer: Self-pay

## 2022-10-29 ENCOUNTER — Ambulatory Visit
Admission: RE | Admit: 2022-10-29 | Discharge: 2022-10-29 | Disposition: A | Discharge status: Still In Hospital | Payer: BC Managed Care – PPO | Source: Ambulatory Visit | Attending: Orthopaedic Surgery | Admitting: Orthopaedic Surgery

## 2022-10-29 NOTE — PT Treatment (Signed)
[facility]  Outpatient Physical Therapy  [place], [address]  [phone]  [phone]    Physical Therapy Treatment Note    Date: 10/29/2022  Patient's Name: Terry Fuller  Date of Birth: 08/23/1970  Physical Therapy Visit            Visit #/POC: 2 / 12  Authorization:  POC Signed?:   POC Ends:   Next Progress Note Due: 10 visits or 11/27/22      Evaluating Physical Therapist: Adalberto Cole, PT  DPT  PT diagnosis/Reason for Referral: R elbow pain  Next Scheduled Physician Appointment: after PT  Allergies/Contraindications:           Subjective: Pt reports shoulder feeling ok but notes more trouble with the elbow.  States she cannot even reach top of head to fix her hair.  Pt notes she is doing exercise at home as advised.  Does feel like she is moving her elbow a little better but still remains painful.      Objective: Activities as noted below. Trial of Kinesiotape with corrective I tape over prox radial head and corrective I over lateral joint line. Pt issued handout for use/care of Kinesiotape     Measured ROM: not measured today  EXERCISE/ACTIVITY NAME REPETITIONS RESISTANCE COMPLETED THIS DOS   Gentle radioulnar mobs/radial distraction     yes   Gentle passive supination/pronation      yes   Biceps curls  Triceps curls  Supination/pronation   2 x 10  2 x 10  5 sec x 10 each Red  Red  1# Yes   Yes   Yes    PNF hand to mouth  PNF hand to top of head   10  10  Yes   yes                                                 Assessment: Pt tolerated well.  Noted tightness with activities and pain at end ranges.  Pt with significant restriction in ROM/function    Short term goals (3 weeks):  -Patient will achieve a resting pain level in the R elbow of no greater than 2/10.  -Patient will achieve a R elbow extension AROM of at least -14 degrees and without onset of pain greater than 2/10.  -Patient will achieve a R elbow flexion AROM of at least 135 degrees  and without onset of pain greater than 2/10.  -Patient will achieve a R wrist extension MMT of at least 4+ and without onset of pain greater than 3/10.  -Patient will have no great than a 2/10 pain response during varus testing of the R elbow.  -Patient will be independent with her HEP.        Long term goals (6 weeks):  -Patient will achieve a resting pain level in the R elbow of no greater than 0/10.  -Patient will achieve a R elbow extension AROM of at least -8 degrees and without onset of pain greater than 1/10.  -Patient will achieve a R elbow flexion AROM of at least 145 degrees and without onset of pain greater than 1/10.  -Patient will achieve a R wrist extension MMT of at least 4+ and without onset of pain greater than 1/10.  -Patient will have no great than a 1/10  pain response during varus testing of the R elbow.  -Patient will be independent with her final HEP.         Plan:   Pt will be out of town until next Friday.  Will resume treatment at that time    Total Session Time 30 and Timed code minutes 30  THERAPEUTIC EXERCISE 30 minutes      Gilberto Stanforth, PTA  10/29/2022, 14:59

## 2022-11-06 ENCOUNTER — Ambulatory Visit (HOSPITAL_COMMUNITY): Payer: Self-pay

## 2022-11-07 ENCOUNTER — Ambulatory Visit (HOSPITAL_COMMUNITY)
Admission: RE | Admit: 2022-11-07 | Discharge: 2022-11-07 | Disposition: A | Discharge status: Still In Hospital | Payer: BC Managed Care – PPO | Source: Ambulatory Visit | Attending: Orthopaedic Surgery | Admitting: Orthopaedic Surgery

## 2022-11-07 ENCOUNTER — Other Ambulatory Visit: Payer: Self-pay

## 2022-11-07 NOTE — PT Treatment (Signed)
[facility]  Outpatient Physical Therapy  [address]  [place], [address]  [phone]  (Fax) [phone]    Physical Therapy Treatment Note    Date: 11/07/2022  Patient's Name: Terry Fuller  Date of Birth: 01/06/1971  Physical Therapy Visit        Visit #/POC: 3 / 12  Authorization:  POC Signed?:   POC Ends:   Next Progress Note Due: 10 visits or 11/27/22        Evaluating Physical Therapist: Creig Hines, PT  DPT  PT diagnosis/Reason for Referral: R elbow pain  Next Scheduled Physician Appointment: after PT  Allergies/Contraindications:            Subjective: States she is trying to do a little more.  Notes she is attempting to use her arm more normal.  Notes she is doing exercise at home as advised.     Objective:  Warm up on UBE followed by activities as noted below.  Reapplication of Kinesiotape (corrective I over radial head and corrective I tape over medial joint line)    Measured ROM: not measured today    EXERCISE/ACTIVITY NAME REPETITIONS RESISTANCE COMPLETED THIS DOS   Gentle radioulnar mobs/radial distraction        yes   Gentle passive supination/pronation         yes   Biceps curls  Triceps curls  Supination/pronation    2 x 10  2 x 10  5 sec x 10 each Red  Red  1# Yes   Yes   Yes    PNF hand to mouth  PNF hand to top of head    10  10   Yes   yes    UBE     5 min    yes    supine hands clasp triceps curls 90 degrees    Supine triceps curls 120 degrees      2 x 10       2 x 10    yes       Yes                                                       Assessment: Pt tolerated treatment well.  She did note fatigue with all activities but is able to tolerate all activities.  Still lacking in full elbow extension    Short term goals (3 weeks):  -Patient will achieve a resting pain level in the R elbow of no greater than 2/10.  -Patient will achieve a R elbow extension AROM of at least -14 degrees and without onset of pain greater than 2/10.  -Patient will achieve a  R elbow flexion AROM of at least 135 degrees and without onset of pain greater than 2/10.  -Patient will achieve a R wrist extension MMT of at least 4+ and without onset of pain greater than 3/10.  -Patient will have no great than a 2/10 pain response during varus testing of the R elbow.  -Patient will be independent with her HEP.        Long term goals (6 weeks):  -Patient will achieve a resting pain level in the R elbow of no greater than 0/10.  -Patient will achieve a R elbow extension AROM of at least -8 degrees and without onset  of pain greater than 1/10.  -Patient will achieve a R elbow flexion AROM of at least 145 degrees and without onset of pain greater than 1/10.  -Patient will achieve a R wrist extension MMT of at least 4+ and without onset of pain greater than 1/10.  -Patient will have no great than a 1/10 pain response during varus testing of the R elbow.  -Patient will be independent with her final HEP.           Plan: Will continue and progress as tolerated     Total Session Time 43 and Timed code minutes 43  THERAPEUTIC EXERCISE 43 minutes      Anthonella Klausner, PTA  11/07/2022, 14:47

## 2022-11-10 ENCOUNTER — Ambulatory Visit (HOSPITAL_COMMUNITY)
Admission: RE | Admit: 2022-11-10 | Discharge: 2022-11-10 | Disposition: A | Discharge status: Still In Hospital | Payer: BC Managed Care – PPO | Source: Ambulatory Visit | Attending: Orthopaedic Surgery | Admitting: Orthopaedic Surgery

## 2022-11-10 NOTE — PT Treatment (Signed)
[facility]  Outpatient Physical Therapy  [address]  [place], [address]  [phone]  [phone]) [phone]    Physical Therapy Treatment Note    Date: 11/10/2022  Patient's Name: Terry Fuller  Date of Birth: October 11, 1970  Physical Therapy Visit    Visit #/POC: 4 / 12  Authorization:  POC Signed?:   POC Ends:   Next Progress Note Due: 10 visits or 11/27/22     Evaluating Physical Therapist: Creig Hines, PT  DPT  PT diagnosis/Reason for Referral: R elbow pain  Next Scheduled Physician Appointment: after PT  Allergies/Contraindications:         Subjective: Patient reports her starting pain level to be a 2/10 prior to this session. She states she did feel improvement in pain level following the last session.     Objective:  Skin reaction where kinesiotape was applied.     Measured ROM:      EXERCISE/ACTIVITY NAME REPETITIONS RESISTANCE COMPLETED THIS DOS   Gentle radioulnar mobs/radial distraction        N   Gentle passive supination/pronation         N   Biceps curls  Triceps curls  Supination/pronation    2 x 10  2 x 10  5 sec x 10 each Red  Red  1# N  N  N   PNF hand to mouth  PNF hand to top of head    10  10   N  N    UBE     5 min   N    supine hands clasp triceps curls 90 degrees     Supine triceps curls 120 degrees      2 x 10         2 x 10   N        N     Seated wrist pro/sup (90)   Seated wrist pro/sup (0) 2x6x5s holds  1x6x5s holds Yellow TB   Yellow TB Y  Y    Swimmer Curls     4x8  2lbs Y    Declined Pronated curls  3x12  Yellow TB  Y            Assessment: Patient generally tolerated treatment well during today's session with the exception of wrist supination with the elbow in full extension. No other exercises required modification and there was a slight increase in soreness by the end of the session.     Short term goals (3 weeks):  -Patient will achieve a resting pain level in the R elbow of no greater than 2/10.  -Patient will achieve a R elbow extension  AROM of at least -14 degrees and without onset of pain greater than 2/10.  -Patient will achieve a R elbow flexion AROM of at least 135 degrees and without onset of pain greater than 2/10.  -Patient will achieve a R wrist extension MMT of at least 4+ and without onset of pain greater than 3/10.  -Patient will have no great than a 2/10 pain response during varus testing of the R elbow.  -Patient will be independent with her HEP.        Long term goals (6 weeks):  -Patient will achieve a resting pain level in the R elbow of no greater than 0/10.  -Patient will achieve a R elbow extension AROM of at least -8 degrees and without onset of pain greater than 1/10.  -Patient will achieve a R  elbow flexion AROM of at least 145 degrees and without onset of pain greater than 1/10.  -Patient will achieve a R wrist extension MMT of at least 4+ and without onset of pain greater than 1/10.  -Patient will have no great than a 1/10 pain response during varus testing of the R elbow.  -Patient will be independent with her final HEP.       Plan: D/C kinesiotape use. Will continue and progress as tolerated.    Total Session Time 36  THERAPEUTIC EXERCISE 36 minutes      Creig Hines, PT  11/10/2022, 13:33

## 2022-11-12 ENCOUNTER — Ambulatory Visit (HOSPITAL_COMMUNITY)
Admission: RE | Admit: 2022-11-12 | Discharge: 2022-11-12 | Disposition: A | Discharge status: Still In Hospital | Payer: BC Managed Care – PPO | Source: Ambulatory Visit | Attending: Orthopaedic Surgery | Admitting: Orthopaedic Surgery

## 2022-11-12 ENCOUNTER — Other Ambulatory Visit: Payer: Self-pay

## 2022-11-12 NOTE — PT Treatment (Signed)
[facility] Medicine [facility]  Outpatient Physical Therapy  [address]  [place], [address]  [phone]  (Fax) [phone]    Physical Therapy Treatment Note    Date: 11/12/2022  Patient's Name: Terry Fuller  Date of Birth: 07/28/1971  Physical Therapy Visit      Visit #/POC: 5 / 12  Authorization:  POC Signed?: Yes  POC Ends:   Next Progress Note Due: 10 visits or 11/27/22     Evaluating Physical Therapist: Creig Hines, PT  DPT  PT diagnosis/Reason for Referral: R elbow pain  Next Scheduled Physician Appointment: after PT  Allergies/Contraindications:         Subjective: Patient reports her elbow is improving. Still has pain if she lifts anything away from the body.     Objective:  Treatemnt delivered as follows:      Measured ROM:     EXERCISE/ACTIVITY NAME REPETITIONS RESISTANCE COMPLETED THIS DOS   Gentle radioulnar mobs/radial distraction        N   Gentle passive supination/pronation   Elbow extension        y   Biceps curls  Triceps curls  Supination/pronation    2 x 10  2 x 10  5 sec x 10 each Red  Red  1# N  N  N   PNF hand to mouth  PNF hand to top of head    10  10   N  N    UBE     5 min   N    supine hands clasp triceps curls 90 degrees     Supine triceps curls 120 degrees      2 x 10         2 x 10   N        N     Seated wrist pro/sup (90)   Seated wrist pro/sup (0) 2x10x5s holds  2x10x5s holds Yellow TB   Yellow TB Y  Y    Swimmer Curls     4x3  2lbs Y    Declined Pronated curls  x12  2#  Y   Active elbow ext standing at wall x15  y            Assessment: Noted decrease elbow extension and pronation. This did improve following gentle PROM. VC required to prevent compensatory mechanics with elbow extension.     Short term goals (3 weeks):  -Patient will achieve a resting pain level in the R elbow of no greater than 2/10.  -Patient will achieve a R elbow extension AROM of at least -14 degrees and without onset of pain greater than 2/10.  -Patient will achieve a R elbow  flexion AROM of at least 135 degrees and without onset of pain greater than 2/10.  -Patient will achieve a R wrist extension MMT of at least 4+ and without onset of pain greater than 3/10.  -Patient will have no great than a 2/10 pain response during varus testing of the R elbow.  -Patient will be independent with her HEP.        Long term goals (6 weeks):  -Patient will achieve a resting pain level in the R elbow of no greater than 0/10.  -Patient will achieve a R elbow extension AROM of at least -8 degrees and without onset of pain greater than 1/10.  -Patient will achieve a R elbow flexion AROM of at least 145 degrees and without onset of pain greater than  1/10.  -Patient will achieve a R wrist extension MMT of at least 4+ and without onset of pain greater than 1/10.  -Patient will have no great than a 1/10 pain response during varus testing of the R elbow.  -Patient will be independent with her final HEP.        Plan:  Will continue and progress as tolerated.           Total Session Time 35 and Timed code minutes 35  THERAPEUTIC EXERCISE 35 minutes      Trilby Leaver, PTA  11/12/2022, 13:14

## 2022-11-17 ENCOUNTER — Other Ambulatory Visit: Payer: Self-pay

## 2022-11-17 ENCOUNTER — Ambulatory Visit (HOSPITAL_COMMUNITY)
Admission: RE | Admit: 2022-11-17 | Discharge: 2022-11-17 | Disposition: A | Discharge status: Still In Hospital | Payer: BC Managed Care – PPO | Source: Ambulatory Visit | Attending: Orthopaedic Surgery | Admitting: Orthopaedic Surgery

## 2022-11-17 NOTE — PT Treatment (Signed)
[facility]  Outpatient Physical Therapy  [address]  [place], [address]  [phone]  (Fax) [phone]    Physical Therapy Treatment Note    Date: 11/17/2022  Patient's Name: Terry Fuller  Date of Birth: [birth year]  Physical Therapy Visit    Visit #/POC: 6 / 12  Authorization:  POC Signed?: Yes  POC Ends: 12/08/22  Next Progress Note Due: 10 visits or 11/27/22     Evaluating Physical Therapist: Creig Hines, PT  DPT  PT diagnosis/Reason for Referral: R elbow pain  Next Scheduled Physician Appointment: after PT  Allergies/Contraindications:                    Subjective: Pt reports she is sore.  Notes she has been doing well with exercises but notes she did a lot of outside work this weekend.  Notes she did take OTC medicine and pain went away.  States she is about 50% better overall.  Still cannot straighten her elbow completely and having difficulty with overhead and with any wt.   However notes she was able to fix her hair.      Objective: Warm up on UBE followed by activities as noted below.  Did trial of elbow strap to be used during heavy activity but advised on removing it if causes pain    Measured ROM: not measured today  EXERCISE/ACTIVITY NAME REPETITIONS RESISTANCE COMPLETED THIS DOS   Gentle radioulnar mobs/radial distraction        N   Gentle passive supination/pronation   Elbow extension        y   Biceps curls  Triceps curls  Supination/pronation    2 x 10  2 x 10  5 sec x 10 each Red  Red  1# N  N  N   PNF hand to mouth  PNF hand to top of head    10  10   N  N    UBE     5 min   y    supine hands clasp triceps curls 90 degrees     Supine triceps curls 120 degrees      2 x 10         2 x 10   N        N     Seated wrist pro/sup (90)   Seated wrist pro/sup (0) 2x10x5s holds  2x10x5s holds Yellow TB   Yellow TB Y  Y    Swimmer Curls     2x10  2lbs Y    Declined Pronated curls  x12  2#  Y   Active elbow ext standing at wall x15   y        Assessment: Pt  tolerated well.  She does continue to have limited ROM and strength.  She does have improving function and pain control.  She has returned to some heavy activities but does still have pain afterwards.      Short term goals (3 weeks):  -Patient will achieve a resting pain level in the R elbow of no greater than 2/10.  -Patient will achieve a R elbow extension AROM of at least -14 degrees and without onset of pain greater than 2/10.  -Patient will achieve a R elbow flexion AROM of at least 135 degrees and without onset of pain greater than 2/10.  -Patient will achieve a R wrist extension MMT of at least 4+ and without onset of pain greater  than 3/10.  -Patient will have no great than a 2/10 pain response during varus testing of the R elbow.  -Patient will be independent with her HEP.        Long term goals (6 weeks):  -Patient will achieve a resting pain level in the R elbow of no greater than 0/10.  -Patient will achieve a R elbow extension AROM of at least -8 degrees and without onset of pain greater than 1/10.  -Patient will achieve a R elbow flexion AROM of at least 145 degrees and without onset of pain greater than 1/10.  -Patient will achieve a R wrist extension MMT of at least 4+ and without onset of pain greater than 1/10.  -Patient will have no great than a 1/10 pain response during varus testing of the R elbow.  -Patient will be independent with her final HEP.           Plan: Will monitor effects of treatment and proceed accordingly    Total Session Time 30 and Timed code minutes 30  THERAPEUTIC EXERCISE 30 minutes      Shenika Quint, PTA  11/17/2022, 13:18

## 2022-11-19 ENCOUNTER — Ambulatory Visit
Admission: RE | Admit: 2022-11-19 | Discharge: 2022-11-19 | Disposition: A | Discharge status: Still In Hospital | Payer: BC Managed Care – PPO | Source: Ambulatory Visit | Attending: Orthopaedic Surgery | Admitting: Orthopaedic Surgery

## 2022-11-19 ENCOUNTER — Other Ambulatory Visit: Payer: Self-pay

## 2022-11-19 NOTE — PT Treatment (Signed)
[facility] Medicine [facility]  Outpatient Physical Therapy  [address]  [place], [address]  [phone]  (Fax) [phone]    Physical Therapy Treatment Note    Date: 11/19/2022  Patient's Name: Terry Fuller  Date of Birth: [birth year]  Physical Therapy Visit      Visit #/POC: 7 / 12  Authorization:  POC Signed?: Yes  POC Ends: 12/08/22  Next Progress Note Due: 10 visits or 11/27/22     Evaluating Physical Therapist: Creig Hines, PT  DPT  PT diagnosis/Reason for Referral: R elbow pain  Next Scheduled Physician Appointment: after PT  Allergies/Contraindications:                           Subjective: Pt reports she is sore from doing a lot of yard work. States that she feels like her arm is getting a little stronger. States she feels like the elbow strap helped her.     Objective: Warm up on UBE followed by activities as noted below.       Measured ROM: not measured today  EXERCISE/ACTIVITY NAME REPETITIONS RESISTANCE COMPLETED THIS DOS   Gentle radioulnar mobs/radial distraction        N   Gentle passive supination/pronation   Elbow extension        y   Biceps curls  Triceps curls  Supination/pronation    2 x 10  2 x 10  5 sec x 10 each green  green  1# N  N  N   PNF hand to mouth  PNF hand to top of head    10  10   N  N    UBE     5 min   y    supine hands clasp triceps curls 90 degrees     Supine triceps curls 120 degrees      2 x 10         2 x 10   N        N     Seated wrist pro/sup (90)   Seated wrist pro/sup (0) 2x10x5s holds  2x10x5s holds Red TB   Red TB Y  Y    Swimmer Curls     2x10  2lbs Y    Declined Pronated curls  x12  2#  Y   Active elbow ext standing at wall x15   n         Assessment: Limitations noted with  ROM and strength. Tolerated slight increase in theraband resistance.       Short term goals (3 weeks):  -Patient will achieve a resting pain level in the R elbow of no greater than 2/10.  -Patient will achieve a R elbow extension AROM of at least -14 degrees and  without onset of pain greater than 2/10.  -Patient will achieve a R elbow flexion AROM of at least 135 degrees and without onset of pain greater than 2/10.  -Patient will achieve a R wrist extension MMT of at least 4+ and without onset of pain greater than 3/10.  -Patient will have no great than a 2/10 pain response during varus testing of the R elbow.  -Patient will be independent with her HEP.        Long term goals (6 weeks):  -Patient will achieve a resting pain level in the R elbow of no greater than 0/10.  -Patient will achieve a R elbow extension AROM of at  least -8 degrees and without onset of pain greater than 1/10.  -Patient will achieve a R elbow flexion AROM of at least 145 degrees and without onset of pain greater than 1/10.  -Patient will achieve a R wrist extension MMT of at least 4+ and without onset of pain greater than 1/10.  -Patient will have no great than a 1/10 pain response during varus testing of the R elbow.  -Patient will be independent with her final HEP.              Plan: Will monitor effects of treatment and proceed accordingly. Progress as tolerated.            Total Session Time 35 and Timed code minutes 35  THERAPEUTIC EXERCISE 35 minutes      Trilby Leaver, PTA  11/19/2022, 13:12

## 2022-11-26 ENCOUNTER — Ambulatory Visit (HOSPITAL_COMMUNITY)
Admission: RE | Admit: 2022-11-26 | Discharge: 2022-11-26 | Disposition: A | Discharge status: Still In Hospital | Payer: BC Managed Care – PPO | Source: Ambulatory Visit | Attending: Orthopaedic Surgery | Admitting: Orthopaedic Surgery

## 2022-11-26 ENCOUNTER — Other Ambulatory Visit: Payer: Self-pay

## 2022-11-26 DIAGNOSIS — S59902D Unspecified injury of left elbow, subsequent encounter: Secondary | ICD-10-CM | POA: Insufficient documentation

## 2022-11-26 DIAGNOSIS — M25622 Stiffness of left elbow, not elsewhere classified: Secondary | ICD-10-CM | POA: Insufficient documentation

## 2022-11-26 NOTE — PT Treatment (Signed)
[facility] Medicine [facility]  Outpatient Physical Therapy  [address]  [place], [address]  [phone]  (Fax) [phone]    Physical Therapy Treatment Note    Date: 11/26/2022  Patient's Name: Terry Fuller  Date of Birth: 12/08/1970  Physical Therapy Visit            Visit #/POC: 8 / 12  Authorization:  POC Signed?: Yes  POC Ends: 12/08/22  Next Progress Note Due: 10 visits or 11/27/22     Evaluating Physical Therapist: Creig Hines, PT  DPT  PT diagnosis/Reason for Referral: R elbow pain  Next Scheduled Physician Appointment: after PT  Allergies/Contraindications:                  Subjective: Patient states she is doing better. Points to distal triceps and wrist extensors of R elbow.     Objective: Warm up on UBE followed by activities as noted below.       Measured ROM: flexion and extension measured beside original measurements.      Elbow AROM     right left   Flexion 121/ 135 148   Extension -25/ -15 -6   Supination  89 90   Pronation 83 78     EXERCISE/ACTIVITY NAME REPETITIONS RESISTANCE COMPLETED THIS DOS   Gentle radioulnar mobs/radial distraction        y   Gentle passive supination/pronation   Elbow extension      Tricep stretch at wall      Y          y   Biceps curls  Triceps curls  Supination/pronation    2 x 10  2 x 10  5 sec x 10 each green  green  1# N  N  y   PNF hand to mouth  PNF hand to top of head    10  10   N  N    UBE     5 min   y    supine hands clasp triceps curls 90 degrees     Supine triceps curls 120 degrees      2 x 10         2 x 10   N        N    MFR to wrist extensors, tricep and bicep    Rock blade y    Seated wrist pro/sup (90)   Seated wrist pro/sup (0) 2x10x5s holds  2x10x5s holds Red TB   Red TB Y  Y    Swimmer Curls     2x10  2lbs n    Declined Pronated curls  x12  2#  n   Active elbow ext standing at wall x15   n         Assessment: ROM is improving. Most limited with supination, still unable to achieve full ROM.       Short term goals (3  weeks):  -Patient will achieve a resting pain level in the R elbow of no greater than 2/10. MET 11/26/22  -Patient will achieve a R elbow extension AROM of at least -14 degrees and without onset of pain greater than 2/10. Progressing 11/26/22  -Patient will achieve a R elbow flexion AROM of at least 135 degrees and without onset of pain greater than 2/10. Progressing 11/26/22  -Patient will achieve a R wrist extension MMT of at least 4+ and without onset of pain greater than 3/10.  -Patient will have no  great than a 2/10 pain response during varus testing of the R elbow.  -Patient will be independent with her HEP. MET 11/26/22        Long term goals (6 weeks):  -Patient will achieve a resting pain level in the R elbow of no greater than 0/10.  -Patient will achieve a R elbow extension AROM of at least -8 degrees and without onset of pain greater than 1/10.  -Patient will achieve a R elbow flexion AROM of at least 145 degrees and without onset of pain greater than 1/10.  -Patient will achieve a R wrist extension MMT of at least 4+ and without onset of pain greater than 1/10.  -Patient will have no great than a 1/10 pain response during varus testing of the R elbow.  -Patient will be independent with her final HEP.              Plan: Cont per PT POC.        Total Session Time 45, Timed code minutes 45, and Untimed code minutes 0  THERAPEUTIC EXERCISE 15 minutes and JOINT MOBILIZATION/MFR 30 minutes      Laurence Aly, PTA  11/26/2022, 13:23

## 2022-11-28 ENCOUNTER — Other Ambulatory Visit: Payer: Self-pay

## 2022-11-28 ENCOUNTER — Ambulatory Visit (HOSPITAL_COMMUNITY)
Admission: RE | Admit: 2022-11-28 | Discharge: 2022-11-28 | Disposition: A | Discharge status: Still In Hospital | Payer: BC Managed Care – PPO | Source: Ambulatory Visit | Attending: Orthopaedic Surgery | Admitting: Orthopaedic Surgery

## 2022-11-28 NOTE — PT Treatment (Signed)
[facility] Medicine [facility]  Outpatient Physical Therapy  [address]  [place], [address]  [phone]  (Fax) [phone]    Physical Therapy Treatment Note    Date: 11/28/2022  Patient's Name: Terry Fuller  Date of Birth: [birth year]  Physical Therapy Visit      Visit #/POC: 9 / 12  Authorization:  POC Signed?: Yes  POC Ends: 12/08/22  Next Progress Note Due: 10 visits or 11/27/22     Evaluating Physical Therapist: Creig Hines, PT  DPT  PT diagnosis/Reason for Referral: R elbow pain  Next Scheduled Physician Appointment: after PT  Allergies/Contraindications:                Subjective: Pt reports doing well.  Notes some soreness from being active but overall feeling better.  Cannot get the elbow all the way straight.  States about 60% better overall     Objective:  UBE followed by activities as noted below        Elbow AROM     right left   Flexion 121/ 135 148   Extension -25/ -15 -6   Supination  89 90   Pronation 83 78      EXERCISE/ACTIVITY NAME REPETITIONS RESISTANCE COMPLETED THIS DOS   Gentle radioulnar mobs/radial distraction        y   Gentle passive supination/pronation   Elbow extension        Tricep stretch at wall      Y              y   Biceps curls  Triceps curls  Supination/pronation    2 x 10  2 x 10  5 sec x 10 each green  green  1# y  y  n   PNF hand to mouth  PNF hand to top of head    10  10   N  N    UBE     5 min   y    supine hands clasp triceps curls 90 degrees     Supine triceps curls 120 degrees      2 x 10         2 x 10   N        N    MFR to wrist extensors, tricep and bicep      manual y    Seated wrist pro/sup (90)   Seated wrist pro/sup (0) 2x10x5s holds  2x10x5s holds Red TB   Red TB Y  Y    Swimmer Curls     2x10  2lbs n    Declined Pronated curls  x12  2#  n   Active elbow ext standing at wall x15   n          Assessment: Pt tolerated treatment well.  She does continue to have some restriction in ROM and strength.  Has been very difficult to gain the  last few degrees of extension.      Short term goals (3 weeks):  -Patient will achieve a resting pain level in the R elbow of no greater than 2/10. MET 11/26/22  -Patient will achieve a R elbow extension AROM of at least -14 degrees and without onset of pain greater than 2/10. Progressing 11/26/22  -Patient will achieve a R elbow flexion AROM of at least 135 degrees and without onset of pain greater than 2/10. Progressing 11/26/22  -Patient will achieve a R wrist extension MMT of at  least 4+ and without onset of pain greater than 3/10.  -Patient will have no great than a 2/10 pain response during varus testing of the R elbow.  -Patient will be independent with her HEP. MET 11/26/22        Long term goals (6 weeks):  -Patient will achieve a resting pain level in the R elbow of no greater than 0/10.  -Patient will achieve a R elbow extension AROM of at least -8 degrees and without onset of pain greater than 1/10.  -Patient will achieve a R elbow flexion AROM of at least 145 degrees and without onset of pain greater than 1/10.  -Patient will achieve a R wrist extension MMT of at least 4+ and without onset of pain greater than 1/10.  -Patient will have no great than a 1/10 pain response during varus testing of the R elbow.  -Patient will be independent with her final HEP.        Plan: Will continue and progress as tolerated.  3 more on POC.  PT to reassess    Total Session Time 35 and Timed code minutes 35  THERAPEUTIC EXERCISE 35 minutes      Ellory Khurana, PTA  11/28/2022, 14:00

## 2022-12-01 ENCOUNTER — Other Ambulatory Visit: Payer: Self-pay

## 2022-12-01 ENCOUNTER — Ambulatory Visit
Admission: RE | Admit: 2022-12-01 | Discharge: 2022-12-01 | Disposition: A | Discharge status: Still In Hospital | Payer: BC Managed Care – PPO | Source: Ambulatory Visit | Attending: Orthopaedic Surgery | Admitting: Orthopaedic Surgery

## 2022-12-01 DIAGNOSIS — M25521 Pain in right elbow: Secondary | ICD-10-CM

## 2022-12-01 NOTE — PT Treatment (Addendum)
[facility] Medicine [facility]  Outpatient Physical Therapy  [address]  [place], [address]  [phone]  (Fax) [phone]    Physical Therapy Treatment Note    Date: 12/01/2022  Patient's Name: Terry Fuller  Date of Birth: 13-Aug-1970  Physical Therapy Visit        Visit #/POC: 10 / 12  Authorization:  POC Signed?: Yes  POC Ends: 12/08/22  Next Progress Note Due: 10 visits or 11/27/22     Evaluating Physical Therapist: Creig Hines, PT  DPT  PT diagnosis/Reason for Referral: R elbow pain  Next Scheduled Physician Appointment: after PT  Allergies/Contraindications:            Subjective: Can do all of her work duties now.  Notes she did lift her grandbabies yesterday and did ok.  Notes it did cause about 3/10 pain that resolved when she stopped the activity.  Does have some delayed soreness with all activities.      Objective: Warm up on UBE followed by activities as noted below.        Elbow AROM     right left   Flexion 121/ 135 148   Extension -25/ -15 -6   Supination  89 90   Pronation 83 78      EXERCISE/ACTIVITY NAME REPETITIONS RESISTANCE COMPLETED THIS DOS   Gentle radioulnar mobs/radial distraction        y   Gentle passive supination/pronation   Elbow extension        Tricep stretch at wall      Y              y   Biceps curls  Triceps curls  Supination/pronation    2 x 10  2 x 10  5 sec x 10 each green  green  2# y  y  n   PNF hand to mouth  PNF hand to top of head    10  10   N  N    UBE     5 min   y    supine hands clasp triceps curls 90 degrees     Supine triceps curls 120 degrees      2 x 10         2 x 10   N        N    MFR to wrist extensors, tricep and bicep      manual y    Seated wrist pro/sup (90)   Seated wrist pro/sup (0) 2x10x5s holds  2x10x5s holds Red TB   Red TB N  n    Swimmer Curls     2x10  2lbs n   Row machine 3 x 10  40#  y    Hand-hand mediball toss   Mediball around back toss 20  20 each way  2#   2#  Y   y          Declined Pronated curls  x12  2#  n    Active elbow ext standing at wall x15   n             Assessment: Pt tolerated treatment well.  She has shown good improvement since starting therapy.  Still has some limitation in elbow flexion/extension as well as limited strength.  She has good supination/pronation but is still painful with this movement    Short term goals (3 weeks):  -Patient will achieve a resting pain level in the  R elbow of no greater than 2/10. MET 11/26/22  -Patient will achieve a R elbow extension AROM of at least -14 degrees and without onset of pain greater than 2/10. Progressing 11/26/22  -Patient will achieve a R elbow flexion AROM of at least 135 degrees and without onset of pain greater than 2/10. Progressing 11/26/22  -Patient will achieve a R wrist extension MMT of at least 4+ and without onset of pain greater than 3/10.  -Patient will have no great than a 2/10 pain response during varus testing of the R elbow.  -Patient will be independent with her HEP. MET 11/26/22        Long term goals (6 weeks):  -Patient will achieve a resting pain level in the R elbow of no greater than 0/10.  -Patient will achieve a R elbow extension AROM of at least -8 degrees and without onset of pain greater than 1/10.  -Patient will achieve a R elbow flexion AROM of at least 145 degrees and without onset of pain greater than 1/10.  -Patient will achieve a R wrist extension MMT of at least 4+ and without onset of pain greater than 1/10.  -Patient will have no great than a 1/10 pain response during varus testing of the R elbow.  -Patient will be independent with her final HEP.             Plan: PT will reassess next visit    Total Session Time 43 and Timed code minutes 43  THERAPEUTIC EXERCISE 43 minutes      Leisha Truitt, PTA  12/01/2022, 13:15      *Following addendum was made on 01/20/2023 by Creig Hines, DPT*    Patient has not returned for follow up visits and it has now been longer than 30 calendar days since the patient was last seen. Patient is  discharged from PT services.

## 2022-12-03 ENCOUNTER — Ambulatory Visit (HOSPITAL_COMMUNITY): Payer: Self-pay

## 2023-04-02 ENCOUNTER — Other Ambulatory Visit (HOSPITAL_COMMUNITY): Payer: Self-pay

## 2023-04-02 DIAGNOSIS — Z1231 Encounter for screening mammogram for malignant neoplasm of breast: Secondary | ICD-10-CM

## 2023-04-28 ENCOUNTER — Inpatient Hospital Stay
Admission: RE | Admit: 2023-04-28 | Discharge: 2023-04-28 | Disposition: A | Discharge status: Home / Self Care | Payer: BC Managed Care – PPO | Source: Ambulatory Visit

## 2023-04-28 ENCOUNTER — Other Ambulatory Visit: Payer: Self-pay

## 2023-04-28 ENCOUNTER — Encounter (HOSPITAL_COMMUNITY): Payer: Self-pay

## 2023-04-28 DIAGNOSIS — Z1231 Encounter for screening mammogram for malignant neoplasm of breast: Secondary | ICD-10-CM | POA: Insufficient documentation

## 2023-06-22 ENCOUNTER — Other Ambulatory Visit (HOSPITAL_COMMUNITY): Payer: Self-pay

## 2023-06-22 ENCOUNTER — Ambulatory Visit
Admission: RE | Admit: 2023-06-22 | Discharge: 2023-06-22 | Disposition: A | Discharge status: Home / Self Care | Payer: BC Managed Care – PPO | Source: Ambulatory Visit

## 2023-06-22 ENCOUNTER — Other Ambulatory Visit: Payer: Self-pay

## 2023-06-22 DIAGNOSIS — M545 Low back pain, unspecified: Secondary | ICD-10-CM | POA: Insufficient documentation

## 2023-06-22 DIAGNOSIS — N200 Calculus of kidney: Secondary | ICD-10-CM | POA: Insufficient documentation

## 2023-06-22 DIAGNOSIS — G3183 Dementia with Lewy bodies: Secondary | ICD-10-CM | POA: Insufficient documentation

## 2023-06-22 DIAGNOSIS — F0280 Dementia in other diseases classified elsewhere without behavioral disturbance: Secondary | ICD-10-CM | POA: Insufficient documentation

## 2023-06-22 DIAGNOSIS — R109 Unspecified abdominal pain: Secondary | ICD-10-CM | POA: Insufficient documentation

## 2023-06-22 DIAGNOSIS — N390 Urinary tract infection, site not specified: Secondary | ICD-10-CM | POA: Insufficient documentation

## 2023-06-22 DIAGNOSIS — Z87440 Personal history of urinary (tract) infections: Secondary | ICD-10-CM | POA: Insufficient documentation

## 2024-04-06 ENCOUNTER — Encounter (HOSPITAL_COMMUNITY): Payer: Self-pay

## 2024-04-06 ENCOUNTER — Other Ambulatory Visit (HOSPITAL_COMMUNITY): Payer: Self-pay

## 2024-04-06 DIAGNOSIS — Z780 Asymptomatic menopausal state: Secondary | ICD-10-CM

## 2024-04-06 DIAGNOSIS — Z1231 Encounter for screening mammogram for malignant neoplasm of breast: Secondary | ICD-10-CM

## 2024-05-03 ENCOUNTER — Other Ambulatory Visit: Payer: Self-pay

## 2024-05-03 ENCOUNTER — Ambulatory Visit (HOSPITAL_BASED_OUTPATIENT_CLINIC_OR_DEPARTMENT_OTHER): Admission: RE | Admit: 2024-05-03 | Discharge: 2024-05-03 | Disposition: A | Payer: Self-pay | Source: Ambulatory Visit

## 2024-05-03 ENCOUNTER — Ambulatory Visit
Admission: RE | Admit: 2024-05-03 | Discharge: 2024-05-03 | Disposition: A | Discharge status: Home / Self Care | Payer: Self-pay | Source: Ambulatory Visit

## 2024-05-03 ENCOUNTER — Encounter (HOSPITAL_COMMUNITY): Payer: Self-pay

## 2024-05-03 DIAGNOSIS — Z1231 Encounter for screening mammogram for malignant neoplasm of breast: Secondary | ICD-10-CM | POA: Insufficient documentation

## 2024-05-03 DIAGNOSIS — M8588 Other specified disorders of bone density and structure, other site: Secondary | ICD-10-CM

## 2024-05-03 DIAGNOSIS — Z780 Asymptomatic menopausal state: Secondary | ICD-10-CM | POA: Insufficient documentation
